# Patient Record
Sex: Female | Born: 1980 | Race: White | Hispanic: No | Marital: Married | Smoking: Former smoker
Health system: Southern US, Community
[De-identification: ages and names within clinical notes are randomized; demographics above are authoritative.]

## PROBLEM LIST (undated history)

## (undated) DIAGNOSIS — F909 Attention-deficit hyperactivity disorder, unspecified type: Secondary | ICD-10-CM

## (undated) DIAGNOSIS — K219 Gastro-esophageal reflux disease without esophagitis: Secondary | ICD-10-CM

## (undated) HISTORY — PX: OTHER SURGICAL HISTORY: SHX169

---
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
## 1999-12-12 ENCOUNTER — Other Ambulatory Visit: Admission: RE | Admit: 1999-12-12 | Discharge: 1999-12-12 | Payer: Self-pay | Admitting: Obstetrics and Gynecology

## 2001-03-18 ENCOUNTER — Other Ambulatory Visit: Admission: RE | Admit: 2001-03-18 | Discharge: 2001-03-18 | Payer: Self-pay | Admitting: Obstetrics and Gynecology

## 2002-06-24 ENCOUNTER — Other Ambulatory Visit: Admission: RE | Admit: 2002-06-24 | Discharge: 2002-06-24 | Payer: Self-pay | Admitting: Obstetrics and Gynecology

## 2003-07-20 ENCOUNTER — Other Ambulatory Visit: Admission: RE | Admit: 2003-07-20 | Discharge: 2003-07-20 | Payer: Self-pay | Admitting: Obstetrics and Gynecology

## 2004-08-15 ENCOUNTER — Other Ambulatory Visit: Admission: RE | Admit: 2004-08-15 | Discharge: 2004-08-15 | Payer: Self-pay | Admitting: Obstetrics and Gynecology

## 2005-05-21 ENCOUNTER — Other Ambulatory Visit: Payer: Self-pay

## 2005-05-21 ENCOUNTER — Emergency Department: Payer: Self-pay | Admitting: Emergency Medicine

## 2008-09-10 ENCOUNTER — Emergency Department: Payer: Self-pay | Admitting: Emergency Medicine

## 2009-11-23 ENCOUNTER — Inpatient Hospital Stay (HOSPITAL_COMMUNITY): Admission: RE | Admit: 2009-11-23 | Discharge: 2009-11-25 | Payer: Self-pay | Admitting: Obstetrics and Gynecology

## 2010-09-26 LAB — CBC
HCT: 31 % — ABNORMAL LOW (ref 36.0–46.0)
Hemoglobin: 11 g/dL — ABNORMAL LOW (ref 12.0–15.0)
Hemoglobin: 13.1 g/dL (ref 12.0–15.0)
MCHC: 34.8 g/dL (ref 30.0–36.0)
MCV: 95.8 fL (ref 78.0–100.0)
MCV: 96.4 fL (ref 78.0–100.0)
Platelets: 213 10*3/uL (ref 150–400)
RBC: 3.91 MIL/uL (ref 3.87–5.11)
RDW: 13.4 % (ref 11.5–15.5)
WBC: 16.6 10*3/uL — ABNORMAL HIGH (ref 4.0–10.5)
WBC: 17.9 10*3/uL — ABNORMAL HIGH (ref 4.0–10.5)

## 2010-09-26 LAB — RPR: RPR Ser Ql: NONREACTIVE

## 2012-07-10 HISTORY — PX: OTHER SURGICAL HISTORY: SHX169

## 2013-03-11 ENCOUNTER — Inpatient Hospital Stay: Payer: Self-pay | Admitting: Surgery

## 2013-03-11 LAB — COMPREHENSIVE METABOLIC PANEL
Anion Gap: 4 — ABNORMAL LOW (ref 7–16)
Bilirubin,Total: 0.6 mg/dL (ref 0.2–1.0)
Calcium, Total: 9 mg/dL (ref 8.5–10.1)
Chloride: 107 mmol/L (ref 98–107)
Creatinine: 1.03 mg/dL (ref 0.60–1.30)
Glucose: 108 mg/dL — ABNORMAL HIGH (ref 65–99)
Osmolality: 279 (ref 275–301)
SGOT(AST): 97 U/L — ABNORMAL HIGH (ref 15–37)
Sodium: 139 mmol/L (ref 136–145)

## 2013-03-11 LAB — CBC
HCT: 41.6 % (ref 35.0–47.0)
MCH: 31.4 pg (ref 26.0–34.0)
MCV: 93 fL (ref 80–100)
Platelet: 305 10*3/uL (ref 150–440)
RDW: 14.2 % (ref 11.5–14.5)
WBC: 11.3 10*3/uL — ABNORMAL HIGH (ref 3.6–11.0)

## 2013-03-11 LAB — URINALYSIS, COMPLETE
Glucose,UR: NEGATIVE mg/dL (ref 0–75)
Ketone: NEGATIVE
Leukocyte Esterase: NEGATIVE
Ph: 5 (ref 4.5–8.0)
Protein: 30
Squamous Epithelial: 3

## 2013-03-11 LAB — LIPASE, BLOOD: Lipase: 139 U/L (ref 73–393)

## 2013-03-12 LAB — COMPREHENSIVE METABOLIC PANEL
Albumin: 2.7 g/dL — ABNORMAL LOW (ref 3.4–5.0)
Alkaline Phosphatase: 101 U/L (ref 50–136)
Anion Gap: 6 — ABNORMAL LOW (ref 7–16)
Bilirubin,Total: 0.4 mg/dL (ref 0.2–1.0)
Chloride: 107 mmol/L (ref 98–107)
Creatinine: 0.74 mg/dL (ref 0.60–1.30)
Osmolality: 274 (ref 275–301)
Sodium: 138 mmol/L (ref 136–145)
Total Protein: 6.4 g/dL (ref 6.4–8.2)

## 2013-03-12 LAB — HCG, QUANTITATIVE, PREGNANCY: Beta Hcg, Quant.: <1 m[IU]/mL — ABNORMAL LOW

## 2013-03-14 LAB — PATHOLOGY REPORT

## 2013-08-05 LAB — OB RESULTS CONSOLE HIV ANTIBODY (ROUTINE TESTING): HIV: NONREACTIVE

## 2014-02-02 LAB — OB RESULTS CONSOLE RUBELLA ANTIBODY, IGM: Rubella: IMMUNE

## 2014-02-02 LAB — OB RESULTS CONSOLE HEPATITIS B SURFACE ANTIGEN: HEP B S AG: NEGATIVE

## 2014-02-02 LAB — OB RESULTS CONSOLE ABO/RH

## 2014-08-03 NOTE — H&P (Signed)
Kendra Bishop is a 34 y.o. female presenting for repeat c/s.  Pregnancy uncomplicated. History OB History    No data available     No past medical history on file. No past surgical history on file. Family History: family history is not on file. Social History:  has no tobacco, alcohol, and drug history on file.   Prenatal Transfer Tool  Maternal Diabetes: No Genetic Screening: Normal Maternal Ultrasounds/Referrals: Normal Fetal Ultrasounds or other Referrals:  None Maternal Substance Abuse:  No Significant Maternal Medications:  None Significant Maternal Lab Results:  None Other Comments:  None  ROS    There were no vitals taken for this visit. Exam Physical Exam   Closed and thick Prenatal labs: ABO, Rh:   Antibody:   Rubella:   RPR:    HBsAg:    HIV:    GBS:     Assessment/Plan: IUP at 39 weeks .  Previous C/S for repeat. Previously tolerated Ancef at 1st C/S Risks and benefits of C/S were discussed.  All questions were answered and informed consent was obtained.  Plan to proceed with low segment transverse Cesarean Section.    Amahd Morino C 08/03/2014, 1:23 PM

## 2014-08-05 ENCOUNTER — Encounter (HOSPITAL_COMMUNITY): Payer: Self-pay | Admitting: *Deleted

## 2014-08-05 ENCOUNTER — Inpatient Hospital Stay (HOSPITAL_COMMUNITY): Payer: BLUE CROSS/BLUE SHIELD | Admitting: Anesthesiology

## 2014-08-05 ENCOUNTER — Inpatient Hospital Stay (HOSPITAL_COMMUNITY)
Admission: AD | Admit: 2014-08-05 | Discharge: 2014-08-07 | DRG: 766 | Disposition: A | Discharge status: Home / Self Care | Payer: BLUE CROSS/BLUE SHIELD | Source: Ambulatory Visit | Attending: Obstetrics and Gynecology | Admitting: Obstetrics and Gynecology

## 2014-08-05 ENCOUNTER — Encounter (HOSPITAL_COMMUNITY)
Admission: AD | Disposition: A | Discharge status: Home / Self Care | Payer: Self-pay | Source: Ambulatory Visit | Attending: Obstetrics and Gynecology

## 2014-08-05 DIAGNOSIS — O3421 Maternal care for scar from previous cesarean delivery: Principal | ICD-10-CM | POA: Diagnosis present

## 2014-08-05 DIAGNOSIS — O4290 Premature rupture of membranes, unspecified as to length of time between rupture and onset of labor, unspecified weeks of gestation: Secondary | ICD-10-CM | POA: Diagnosis present

## 2014-08-05 DIAGNOSIS — Z3A38 38 weeks gestation of pregnancy: Secondary | ICD-10-CM | POA: Diagnosis present

## 2014-08-05 DIAGNOSIS — Z3483 Encounter for supervision of other normal pregnancy, third trimester: Secondary | ICD-10-CM | POA: Diagnosis present

## 2014-08-05 HISTORY — DX: Gastro-esophageal reflux disease without esophagitis: K21.9

## 2014-08-05 LAB — TYPE AND SCREEN
ABO/RH(D): A POS
Antibody Screen: NEGATIVE

## 2014-08-05 LAB — CBC
HCT: 39.1 % (ref 36.0–46.0)
HEMOGLOBIN: 13.3 g/dL (ref 12.0–15.0)
MCH: 32.8 pg (ref 26.0–34.0)
MCHC: 34 g/dL (ref 30.0–36.0)
MCV: 96.3 fL (ref 78.0–100.0)
PLATELETS: 239 10*3/uL (ref 150–400)
RBC: 4.06 MIL/uL (ref 3.87–5.11)
RDW: 14.4 % (ref 11.5–15.5)
WBC: 15.6 10*3/uL — AB (ref 4.0–10.5)

## 2014-08-05 LAB — ABO/RH: ABO/RH(D): A POS

## 2014-08-05 SURGERY — Surgical Case
Anesthesia: Spinal

## 2014-08-05 MED ORDER — ZOLPIDEM TARTRATE 5 MG PO TABS: 5.0000 mg | ORAL_TABLET | Freq: Every evening | ORAL | Status: DC | PRN

## 2014-08-05 MED ORDER — OXYCODONE-ACETAMINOPHEN 5-325 MG PO TABS
2.0000 | ORAL_TABLET | ORAL | Status: DC | PRN
  Administered 2014-08-05 – 2014-08-06 (×3): 2 via ORAL
  Filled 2014-08-05 (×3): qty 2

## 2014-08-05 MED ORDER — LACTATED RINGERS IV SOLN
INTRAVENOUS | Status: DC
  Administered 2014-08-05: 15:00:00 via INTRAVENOUS

## 2014-08-05 MED ORDER — SIMETHICONE 80 MG PO CHEW: 80.0000 mg | CHEWABLE_TABLET | ORAL | Status: DC | PRN

## 2014-08-05 MED ORDER — LACTATED RINGERS IV SOLN
INTRAVENOUS | Status: DC
  Administered 2014-08-05 (×4): via INTRAVENOUS

## 2014-08-05 MED ORDER — SIMETHICONE 80 MG PO CHEW
80.0000 mg | CHEWABLE_TABLET | Freq: Three times a day (TID) | ORAL | Status: DC
Start: 2014-08-05 — End: 2014-08-07
  Administered 2014-08-05 – 2014-08-07 (×5): 80 mg via ORAL
  Filled 2014-08-05 (×6): qty 1

## 2014-08-05 MED ORDER — MENTHOL 3 MG MT LOZG: 1.0000 | LOZENGE | OROMUCOSAL | Status: DC | PRN

## 2014-08-05 MED ORDER — FENTANYL CITRATE 0.05 MG/ML IJ SOLN
INTRAMUSCULAR | Status: DC | PRN
  Administered 2014-08-05: 25 ug via INTRATHECAL

## 2014-08-05 MED ORDER — LACTATED RINGERS IV SOLN
40.0000 [IU] | INTRAVENOUS | Status: DC | PRN
  Administered 2014-08-05: 40 [IU] via INTRAVENOUS

## 2014-08-05 MED ORDER — CEFAZOLIN SODIUM-DEXTROSE 2-3 GM-% IV SOLR
INTRAVENOUS | Status: AC
  Filled 2014-08-05: qty 50

## 2014-08-05 MED ORDER — ONDANSETRON HCL 4 MG/2ML IJ SOLN: 4.0000 mg | INTRAMUSCULAR | Status: DC | PRN

## 2014-08-05 MED ORDER — NALOXONE HCL 1 MG/ML IJ SOLN
1.0000 ug/kg/h | INTRAVENOUS | Status: DC | PRN
  Filled 2014-08-05: qty 2

## 2014-08-05 MED ORDER — OXYTOCIN 10 UNIT/ML IJ SOLN
INTRAMUSCULAR | Status: AC
Start: 2014-08-05 — End: 2014-08-05
  Filled 2014-08-05: qty 4

## 2014-08-05 MED ORDER — IBUPROFEN 600 MG PO TABS
600.0000 mg | ORAL_TABLET | Freq: Four times a day (QID) | ORAL | Status: DC
  Administered 2014-08-05 – 2014-08-07 (×8): 600 mg via ORAL
  Filled 2014-08-05 (×9): qty 1

## 2014-08-05 MED ORDER — LANOLIN HYDROUS EX OINT: 1.0000 "application " | TOPICAL_OINTMENT | CUTANEOUS | Status: DC | PRN

## 2014-08-05 MED ORDER — BUPIVACAINE LIPOSOME 1.3 % IJ SUSP
INTRAMUSCULAR | Status: DC | PRN
  Administered 2014-08-05: 20 mL

## 2014-08-05 MED ORDER — ERYTHROMYCIN 5 MG/GM OP OINT
TOPICAL_OINTMENT | OPHTHALMIC | Status: AC
  Filled 2014-08-05: qty 1

## 2014-08-05 MED ORDER — OXYTOCIN 40 UNITS IN LACTATED RINGERS INFUSION - SIMPLE MED: 62.5000 mL/h | INTRAVENOUS | Status: AC

## 2014-08-05 MED ORDER — FENTANYL CITRATE 0.05 MG/ML IJ SOLN
INTRAMUSCULAR | Status: AC
  Filled 2014-08-05: qty 2

## 2014-08-05 MED ORDER — NALBUPHINE HCL 10 MG/ML IJ SOLN: 5.0000 mg | Freq: Once | INTRAMUSCULAR | Status: AC | PRN

## 2014-08-05 MED ORDER — NALBUPHINE HCL 10 MG/ML IJ SOLN: 5.0000 mg | INTRAMUSCULAR | Status: DC | PRN

## 2014-08-05 MED ORDER — DIPHENHYDRAMINE HCL 25 MG PO CAPS: 25.0000 mg | ORAL_CAPSULE | ORAL | Status: DC | PRN

## 2014-08-05 MED ORDER — SENNOSIDES-DOCUSATE SODIUM 8.6-50 MG PO TABS
2.0000 | ORAL_TABLET | ORAL | Status: DC
  Administered 2014-08-05 – 2014-08-07 (×2): 2 via ORAL
  Filled 2014-08-05 (×2): qty 2

## 2014-08-05 MED ORDER — PHENYLEPHRINE HCL 10 MG/ML IJ SOLN
INTRAMUSCULAR | Status: DC | PRN
  Administered 2014-08-05 (×2): 60 ug via INTRAVENOUS

## 2014-08-05 MED ORDER — PRENATAL MULTIVITAMIN CH
1.0000 | ORAL_TABLET | Freq: Every day | ORAL | Status: DC
  Administered 2014-08-06 – 2014-08-07 (×2): 1 via ORAL
  Filled 2014-08-05 (×2): qty 1

## 2014-08-05 MED ORDER — WITCH HAZEL-GLYCERIN EX PADS: 1.0000 "application " | MEDICATED_PAD | CUTANEOUS | Status: DC | PRN

## 2014-08-05 MED ORDER — MORPHINE SULFATE (PF) 0.5 MG/ML IJ SOLN
INTRAMUSCULAR | Status: DC | PRN
  Administered 2014-08-05: 150 ug via EPIDURAL

## 2014-08-05 MED ORDER — MORPHINE SULFATE 0.5 MG/ML IJ SOLN
INTRAMUSCULAR | Status: AC
  Filled 2014-08-05: qty 10

## 2014-08-05 MED ORDER — ONDANSETRON HCL 4 MG/2ML IJ SOLN
INTRAMUSCULAR | Status: AC
  Filled 2014-08-05: qty 2

## 2014-08-05 MED ORDER — ONDANSETRON HCL 4 MG/2ML IJ SOLN: 4.0000 mg | Freq: Three times a day (TID) | INTRAMUSCULAR | Status: DC | PRN

## 2014-08-05 MED ORDER — SCOPOLAMINE 1 MG/3DAYS TD PT72
MEDICATED_PATCH | TRANSDERMAL | Status: AC
  Filled 2014-08-05: qty 1

## 2014-08-05 MED ORDER — DIBUCAINE 1 % RE OINT: 1.0000 "application " | TOPICAL_OINTMENT | RECTAL | Status: DC | PRN

## 2014-08-05 MED ORDER — CITRIC ACID-SODIUM CITRATE 334-500 MG/5ML PO SOLN
30.0000 mL | Freq: Once | ORAL | Status: AC
  Administered 2014-08-05: 30 mL via ORAL
  Filled 2014-08-05: qty 15

## 2014-08-05 MED ORDER — PHENYLEPHRINE 8 MG IN D5W 100 ML (0.08MG/ML) PREMIX OPTIME
INJECTION | INTRAVENOUS | Status: AC
  Filled 2014-08-05: qty 100

## 2014-08-05 MED ORDER — MEPERIDINE HCL 25 MG/ML IJ SOLN: 6.2500 mg | INTRAMUSCULAR | Status: DC | PRN

## 2014-08-05 MED ORDER — BUPIVACAINE HCL (PF) 0.5 % IJ SOLN
INTRAMUSCULAR | Status: DC | PRN
  Administered 2014-08-05: 11 mg

## 2014-08-05 MED ORDER — NALOXONE HCL 0.4 MG/ML IJ SOLN: 0.4000 mg | INTRAMUSCULAR | Status: DC | PRN

## 2014-08-05 MED ORDER — DIPHENHYDRAMINE HCL 50 MG/ML IJ SOLN: 12.5000 mg | INTRAMUSCULAR | Status: DC | PRN

## 2014-08-05 MED ORDER — ONDANSETRON HCL 4 MG/2ML IJ SOLN
INTRAMUSCULAR | Status: DC | PRN
Start: 2014-08-05 — End: 2014-08-05
  Administered 2014-08-05: 4 mg via INTRAVENOUS

## 2014-08-05 MED ORDER — DIPHENHYDRAMINE HCL 25 MG PO CAPS: 25.0000 mg | ORAL_CAPSULE | Freq: Four times a day (QID) | ORAL | Status: DC | PRN

## 2014-08-05 MED ORDER — SODIUM CHLORIDE 0.9 % IJ SOLN: 3.0000 mL | INTRAMUSCULAR | Status: DC | PRN

## 2014-08-05 MED ORDER — ONDANSETRON HCL 4 MG PO TABS: 4.0000 mg | ORAL_TABLET | ORAL | Status: DC | PRN

## 2014-08-05 MED ORDER — HYDROMORPHONE HCL 1 MG/ML IJ SOLN: 0.2500 mg | INTRAMUSCULAR | Status: DC | PRN

## 2014-08-05 MED ORDER — GENTAMICIN SULFATE 40 MG/ML IJ SOLN
INTRAMUSCULAR | Status: AC
  Administered 2014-08-05: 100 mL via INTRAVENOUS
  Filled 2014-08-05: qty 6.75

## 2014-08-05 MED ORDER — SODIUM CHLORIDE 0.9 % IJ SOLN
INTRAMUSCULAR | Status: AC
  Filled 2014-08-05: qty 20

## 2014-08-05 MED ORDER — SIMETHICONE 80 MG PO CHEW
80.0000 mg | CHEWABLE_TABLET | ORAL | Status: DC
  Administered 2014-08-05 – 2014-08-07 (×2): 80 mg via ORAL
  Filled 2014-08-05 (×2): qty 1

## 2014-08-05 MED ORDER — BUPIVACAINE LIPOSOME 1.3 % IJ SUSP
20.0000 mL | Freq: Once | INTRAMUSCULAR | Status: AC
  Administered 2014-08-05 (×2): 20 mL
  Filled 2014-08-05: qty 20

## 2014-08-05 MED ORDER — OXYCODONE-ACETAMINOPHEN 5-325 MG PO TABS
1.0000 | ORAL_TABLET | ORAL | Status: DC | PRN
  Administered 2014-08-06 – 2014-08-07 (×6): 1 via ORAL
  Filled 2014-08-05 (×6): qty 1

## 2014-08-05 MED ORDER — SCOPOLAMINE 1 MG/3DAYS TD PT72
1.0000 | MEDICATED_PATCH | Freq: Once | TRANSDERMAL | Status: DC
  Administered 2014-08-05: 1.5 mg via TRANSDERMAL

## 2014-08-05 MED ORDER — TETANUS-DIPHTH-ACELL PERTUSSIS 5-2.5-18.5 LF-MCG/0.5 IM SUSP: 0.5000 mL | Freq: Once | INTRAMUSCULAR | Status: DC

## 2014-08-05 MED ORDER — FAMOTIDINE IN NACL 20-0.9 MG/50ML-% IV SOLN
20.0000 mg | Freq: Once | INTRAVENOUS | Status: AC
  Administered 2014-08-05: 20 mg via INTRAVENOUS
  Filled 2014-08-05: qty 50

## 2014-08-05 SURGICAL SUPPLY — 37 items
BENZOIN TINCTURE PRP APPL 2/3 (GAUZE/BANDAGES/DRESSINGS) ×3 IMPLANT
CLAMP CORD UMBIL (MISCELLANEOUS) IMPLANT
CLOSURE WOUND 1/2 X4 (GAUZE/BANDAGES/DRESSINGS) ×1
CLOTH BEACON ORANGE TIMEOUT ST (SAFETY) ×3 IMPLANT
CONTAINER PREFILL 10% NBF 15ML (MISCELLANEOUS) IMPLANT
DRAPE SHEET LG 3/4 BI-LAMINATE (DRAPES) IMPLANT
DRSG OPSITE POSTOP 4X10 (GAUZE/BANDAGES/DRESSINGS) ×3 IMPLANT
DURAPREP 26ML APPLICATOR (WOUND CARE) ×3 IMPLANT
ELECT REM PT RETURN 9FT ADLT (ELECTROSURGICAL) ×3
ELECTRODE REM PT RTRN 9FT ADLT (ELECTROSURGICAL) ×1 IMPLANT
EXTRACTOR VACUUM M CUP 4 TUBE (SUCTIONS) IMPLANT
EXTRACTOR VACUUM M CUP 4' TUBE (SUCTIONS)
GLOVE BIO SURGEON STRL SZ7.5 (GLOVE) ×3 IMPLANT
GOWN STRL REUS W/TWL LRG LVL3 (GOWN DISPOSABLE) ×6 IMPLANT
KIT ABG SYR 3ML LUER SLIP (SYRINGE) ×3 IMPLANT
LIQUID BAND (GAUZE/BANDAGES/DRESSINGS) IMPLANT
NEEDLE HYPO 21X1.5 SAFETY (NEEDLE) ×3 IMPLANT
NEEDLE HYPO 25X5/8 SAFETYGLIDE (NEEDLE) ×3 IMPLANT
NS IRRIG 1000ML POUR BTL (IV SOLUTION) ×3 IMPLANT
PACK C SECTION WH (CUSTOM PROCEDURE TRAY) ×3 IMPLANT
PAD ABD 7.5X8 STRL (GAUZE/BANDAGES/DRESSINGS) ×3 IMPLANT
PAD OB MATERNITY 4.3X12.25 (PERSONAL CARE ITEMS) ×3 IMPLANT
SPONGE GAUZE 4X4 12PLY STER LF (GAUZE/BANDAGES/DRESSINGS) ×6 IMPLANT
STAPLER VISISTAT 35W (STAPLE) IMPLANT
STRIP CLOSURE SKIN 1/2X4 (GAUZE/BANDAGES/DRESSINGS) ×2 IMPLANT
SUT CHROMIC 2 0 SH (SUTURE) ×3 IMPLANT
SUT MNCRL 0 VIOLET CTX 36 (SUTURE) ×4 IMPLANT
SUT MONOCRYL 0 CTX 36 (SUTURE) ×8
SUT PDS AB 0 CTX 60 (SUTURE) ×3 IMPLANT
SUT PLAIN 0 NONE (SUTURE) IMPLANT
SUT PLAIN 2 0 (SUTURE)
SUT PLAIN 2 0 XLH (SUTURE) ×3 IMPLANT
SUT PLAIN ABS 2-0 CT1 27XMFL (SUTURE) IMPLANT
SUT VIC AB 4-0 KS 27 (SUTURE) ×3 IMPLANT
SYR 20CC LL (SYRINGE) ×3 IMPLANT
TOWEL OR 17X24 6PK STRL BLUE (TOWEL DISPOSABLE) ×3 IMPLANT
TRAY FOLEY CATH 14FR (SET/KITS/TRAYS/PACK) ×3 IMPLANT

## 2014-08-05 NOTE — Anesthesia Procedure Notes (Signed)
Spinal Patient location during procedure: OR Start time: 08/05/2014 8:22 AM Staffing Anesthesiologist: Brayton CavesJACKSON, Eran Mistry Performed by: anesthesiologist  Preanesthetic Checklist Completed: patient identified, site marked, surgical consent, pre-op evaluation, timeout performed, IV checked, risks and benefits discussed and monitors and equipment checked Spinal Block Patient position: sitting Prep: DuraPrep Patient monitoring: heart rate, cardiac monitor, continuous pulse ox and blood pressure Approach: midline Location: L3-4 Injection technique: single-shot Needle Needle type: Sprotte  Needle gauge: 24 G Needle length: 9 cm Assessment Sensory level: T4 Additional Notes Patient identified.  Risk benefits discussed including failed block, incomplete pain control, headache, nerve damage, paralysis, blood pressure changes, nausea, vomiting, reactions to medication both toxic or allergic, and postpartum back pain.  Patient expressed understanding and wished to proceed.  All questions were answered.  Sterile technique used throughout procedure.  CSF was clear.  No parasthesia or other complications.  Please see nursing notes for vital signs.

## 2014-08-05 NOTE — MAU Note (Signed)
Anesthesia on [id number] notified, Blane OharaCaroline RN [facility] notified,

## 2014-08-05 NOTE — Transfer of Care (Signed)
Immediate Anesthesia Transfer of Care Note  Patient: Kendra Bishop  Procedure(s) Performed: Procedure(s): CESAREAN SECTION (N/A)  Patient Location: PACU  Anesthesia Type:Spinal  Level of Consciousness: awake, alert  and oriented  Airway & Oxygen Therapy: Patient Spontanous Breathing  Post-op Assessment: Report given to PACU RN and Post -op Vital signs reviewed and stable  Post vital signs: Reviewed and stable  Complications: No apparent anesthesia complications

## 2014-08-05 NOTE — Op Note (Signed)
Kendra Bishop, LAFEVERS NO.:  [id number]  MEDICAL RECORD NO.:  [id number]  LOCATION:  [facility]                          FACILITY:  [facility]  PHYSICIAN:  Guy Sandifer. Henderson Cloud, M.D. DATE OF BIRTH:  [birth year]  DATE OF PROCEDURE:  08/05/2014 DATE OF DISCHARGE:                              OPERATIVE REPORT   PREOPERATIVE DIAGNOSES: 1. Intrauterine pregnancy at 27 and 4/7th weeks. 2. Spontaneous rupture of membranes. 3. Previous cesarean section, desires repeat.  POSTOPERATIVE DIAGNOSES: 1. Intrauterine pregnancy at 21 and 4/7th weeks. 2. Spontaneous rupture of membranes. 3. Previous cesarean section, desires repeat.  PROCEDURE:  Repeat low-transverse cesarean section.  SURGEON:  Guy Sandifer. Henderson Cloud, M.D.  ANESTHESIA:  Spinal.  ESTIMATED BLOOD LOSS:  150 mL.  SPECIMENS:  Placenta to Pathology.  FINDINGS:  Viable female infant, Apgars of 9 and 9 at one and five minutes respectively.  Birth weight and arterial cord pH pending.  INDICATIONS AND CONSENT:  This patient is a 34 year old G2, P1, with a previous cesarean section.  She had spontaneous rupture of membranes for clear fluid this morning.  Pregnancy has been complicated for newly- diagnosed cholestasis of pregnancy.  She took her first and only dose of Ursodiol last evening.  The patient desires repeat cesarean section. Potential risks and complications were discussed with the patient and her husband preoperatively including, but not limited to, infection, organ damage, bleeding requiring transfusion of blood products with HIV and hepatitis acquisition, DVT, PE, pneumonia, pelvic pain, abdominal pain, return to the OR, wound breakdown.  All questions have been answered and consent is signed on the chart.  PROCEDURE:  The patient was taken to the operating room, where she was identified.  Spinal anesthetic was placed per Anesthesiology and she was placed in a dorsal supine position with a left lateral wedge.  She  was then prepped, Foley catheter was placed to the bladder to drain.  She was draped per [facility] protocol.  Testing for adequate spinal anesthesia was carried out.  Time-out was undertaken.  Skin was entered through the previous Pfannenstiel scar.  Dissection was carried out in layers to the peritoneum.  There was scarring from the lower uterine segment to the anterior abdominal wall, which was taken down without difficulty.  This allowed development of the bladder flap and the bladder blade was placed.  Uterus was incised in a low-transverse manner.  The uterine cavity was entered bluntly with a hemostat.  The uterine incision was extended cephalad laterally with fingers.  Vertex was delivered without difficulty.  Baby was delivered, good cry and tone was noted.  Cord was clamped and cut.  The baby was handed to awaiting pediatrics team.  Placenta was manually delivered and sent to Pathology. Uterus was cleaned.  Uterus was closed in 2 running locking imbricating layers of 0 Monocryl suture, which achieved good hemostasis.  Anterior peritoneum was closed in a running fashion with 0-Monocryl suture, which was also used to reapproximate the pyramidalis muscle in the midline. The anterior rectus fascia was closed in a running fashion with a 0- looped PDS suture.  There was a small bleeder at the margin of the fascia in the center of  the incision, which was controlled with a 2-0 chromic suture.  20 mL of Exparel diluted in 20 mL of saline was then injected both subfascially and subcutaneously.  The subcutaneous layer was closed with interrupted plain suture and the skin was closed with subcuticular Vicryl on a Keith needle.  Steri-Strips and pressure dressing was applied.  The urine remained clear.  All counts were correct.  The patient was awakened and the patient was taken to recovery room in stable condition.     Guy SandiferJames E. Henderson Cloudomblin, M.D.     JET/MEDQ  D:  08/05/2014  T:   08/05/2014  Job:  [id number]

## 2014-08-05 NOTE — Brief Op Note (Signed)
08/05/2014  9:18 AM  PATIENT:  Kendra Bishop  34 y.o. female  PRE-OPERATIVE DIAGNOSIS:  CESAREAN SECTION  POST-OPERATIVE DIAGNOSIS:  CESAREAN SECTION  PROCEDURE:  Procedure(s): CESAREAN SECTION (N/A)  SURGEON:  Surgeon(s) and Role:    * Leslie AndreaJames E Mayo Owczarzak II, MD - Primary  PHYSICIAN ASSISTANT:   ASSISTANTS: none   ANESTHESIA:   spinal  EBL:  Total I/O In: 1000 [I.V.:1000] Out: 275 [Urine:75; Blood:200]  BLOOD ADMINISTERED:none  DRAINS: Urinary Catheter (Foley)   LOCAL MEDICATIONS USED:  OTHER Exparel 20ml in Saline 20 ml  SPECIMEN:  Source of Specimen:  placenta  DISPOSITION OF SPECIMEN:  PATHOLOGY  COUNTS:  YES  TOURNIQUET:  * No tourniquets in log *  DICTATION: .Other Dictation: Dictation Number [id number]  PLAN OF CARE: Admit to inpatient   PATIENT DISPOSITION:  PACU - hemodynamically stable.   Delay start of Pharmacological VTE agent (>24hrs) due to surgical blood loss or risk of bleeding: not applicable

## 2014-08-05 NOTE — MAU Note (Signed)
G2P1 at 38.5 wks with c/o water breaking at 0430, clear fluid.  Comes in with towel btwn her legs; contractions started at 0500.  No vag bleeding, reports good fetal movement.  C-Section scheduled for this Friday for repeat.

## 2014-08-05 NOTE — Anesthesia Postprocedure Evaluation (Signed)
  Anesthesia Post Note  Patient: Kendra Bishop  Procedure(s) Performed: Procedure(s) (LRB): CESAREAN SECTION (N/A)  Anesthesia type: Spinal  Patient location: PACU  Post pain: Pain level controlled  Post assessment: Post-op Vital signs reviewed  Last Vitals:  Filed Vitals:   08/05/14 0729  BP: 125/68  Pulse: 77  Temp:   Resp: 18    Post vital signs: Reviewed  Level of consciousness: awake  Complications: No apparent anesthesia complications

## 2014-08-05 NOTE — Lactation Note (Signed)
This note was copied from the chart of Kendra Bishop. Lactation Consultation Note Initial visit at 8 hours of age.  Mom reports good feedings.  Baby is latched now and has intermittently been feeding for about 25 minutes.  Offered to assist with latching to other breast and mom declines, but plans to alternate breast with next feeding.  [facility] given and discussed.  Encouraged to feed with early cues on demand.  Early newborn behavior discussed.  Hand expression reported by mom with colostrum visible.  Mom to call for assist as needed.    Patient Name: Kendra Bishop ZOXWR'UToday's Date: 08/05/2014 Reason for consult: Initial assessment   Maternal Data Has patient been taught Hand Expression?: Yes Does the patient have breastfeeding experience prior to this delivery?: Yes  Feeding Feeding Type: Breast Fed Length of feed: 45 min  LATCH Score/Interventions Latch: Grasps breast easily, tongue down, lips flanged, rhythmical sucking. Intervention(s): Skin to skin  Audible Swallowing: A few with stimulation  Type of Nipple: Everted at rest and after stimulation  Comfort (Breast/Nipple): Soft / non-tender     Hold (Positioning): Assistance needed to correctly position infant at breast and maintain latch. Intervention(s): Skin to skin;Position options;Support Pillows;Breastfeeding basics reviewed  LATCH Score: 8  Lactation Tools Discussed/Used     Consult Status Consult Status: Follow-up Date: 08/06/14 Follow-up type: In-patient    Kendra RisenShoptaw, Kendra Bishop 08/05/2014, 5:39 PM

## 2014-08-05 NOTE — Anesthesia Preprocedure Evaluation (Signed)
Anesthesia Evaluation  Patient identified by MRN, date of birth, ID band Patient awake    Reviewed: Allergy & Precautions, H&P , Patient's Chart, lab work & pertinent test results  Airway Mallampati: II  TM Distance: >3 FB Neck ROM: full    Dental no notable dental hx.    Pulmonary former smoker,  breath sounds clear to auscultation  Pulmonary exam normal       Cardiovascular Exercise Tolerance: Good Rhythm:regular Rate:Normal     Neuro/Psych    GI/Hepatic GERD-  ,  Endo/Other    Renal/GU      Musculoskeletal   Abdominal   Peds  Hematology   Anesthesia Other Findings   Reproductive/Obstetrics                             Anesthesia Physical Anesthesia Plan  ASA: II  Anesthesia Plan: Spinal   Post-op Pain Management:    Induction:   Airway Management Planned:   Additional Equipment:   Intra-op Plan:   Post-operative Plan:   Informed Consent: I have reviewed the patients History and Physical, chart, labs and discussed the procedure including the risks, benefits and alternatives for the proposed anesthesia with the patient or authorized representative who has indicated his/her understanding and acceptance.   Dental Advisory Given  Plan Discussed with: CRNA  Anesthesia Plan Comments: (Lab work confirmed with CRNA in room. Platelets okay. Discussed spinal anesthetic, and patient consents to the procedure:  included risk of possible headache,backache, failed block, allergic reaction, and nerve injury. This patient was asked if she had any questions or concerns before the procedure started. )        Anesthesia Quick Evaluation

## 2014-08-05 NOTE — H&P (Signed)
Kendra Bishop is a 34 y.o. female presenting for SROM this am. Had previous c/s for breech and desires repeat. Itching since about 36 weeks. Apparently bile salts elevated. Took first and only dose of ursadiol last evening. Maternal Medical History:  Reason for admission: Rupture of membranes.   Fetal activity: Perceived fetal activity is normal.      OB History    Gravida Para Term Preterm AB TAB SAB Ectopic Multiple Living   2 1 1       1      Past Medical History  Diagnosis Date  . GERD (gastroesophageal reflux disease)    Past Surgical History  Procedure Laterality Date  . Cesarean section    . Cholecysectomy     Family History: family history is not on file. Social History:  reports that she quit smoking about 3 months ago. She does not have any smokeless tobacco history on file. She reports that she does not drink alcohol or use illicit drugs.   Prenatal Transfer Tool  Maternal Diabetes: No Genetic Screening: Normal Maternal Ultrasounds/Referrals: Normal Fetal Ultrasounds or other Referrals:  None Maternal Substance Abuse:  No Significant Maternal Medications:  Meds include: Other: ursodiol Significant Maternal Lab Results:  None Other Comments:  None  Review of Systems  Eyes: Negative for blurred vision.  Gastrointestinal: Negative for abdominal pain.  Skin: Positive for itching.  Neurological: Negative for headaches.    Dilation: 2.5 Effacement (%): 50 Exam by:: Fleeta EmmerN. Dunbar RN Blood pressure 125/68, pulse 77, temperature 98.5 F (36.9 C), temperature source Oral, resp. rate 18, height 5' (1.524 m), weight 151 lb (68.493 kg), last menstrual period 11/08/2013. Maternal Exam:  Abdomen: Patient reports no abdominal tenderness.   Fetal Exam Fetal State Assessment: Category I - tracings are normal.     Physical Exam  Cardiovascular: Normal rate and regular rhythm.   Respiratory: Effort normal and breath sounds normal.  GI: Soft. There is no tenderness.   Neurological: She has normal reflexes.    Prenatal labs: ABO, Rh: --/--/A POS (01/27 [id number]) Antibody: PENDING (01/27 0650) Rubella:   RPR:    HBsAg:    HIV:    GBS:     Assessment/Plan: 34 yo G2P1 @ 38 4/7 weeks for repeat cesarean section D/W patient and husband procedure and risks including infection, organ damage, bleeding/transfusion-HIV/Hep, DVT/PE,pneumonia, return to OR, wound breakdown,and abdominal/pelvic pain. Patient states she understands and agrees, all questions answered   Hitomi Slape II,Lyvonne Cassell E 08/05/2014, 7:50 AM

## 2014-08-06 ENCOUNTER — Inpatient Hospital Stay (HOSPITAL_COMMUNITY): Admission: RE | Admit: 2014-08-06 | Payer: Self-pay | Source: Ambulatory Visit

## 2014-08-06 LAB — CBC
HEMATOCRIT: 33.7 % — AB (ref 36.0–46.0)
Hemoglobin: 11.4 g/dL — ABNORMAL LOW (ref 12.0–15.0)
MCH: 33 pg (ref 26.0–34.0)
MCHC: 33.8 g/dL (ref 30.0–36.0)
MCV: 97.7 fL (ref 78.0–100.0)
Platelets: 190 10*3/uL (ref 150–400)
RBC: 3.45 MIL/uL — AB (ref 3.87–5.11)
RDW: 14.6 % (ref 11.5–15.5)
WBC: 15.6 10*3/uL — ABNORMAL HIGH (ref 4.0–10.5)

## 2014-08-06 NOTE — Lactation Note (Signed)
This note was copied from the chart of Kendra Bishop Murakami. Lactation Consultation Note: Follow up visit with mom. She reports baby has been nursing well today until circ this morning.Has been sleepy since. Eating lunch- states she will try again when she finishes. Reviewed cluster feeding when he wakes from circ and tonight. Encouraged to take a nap this afternoon. No questions at present. To call prn Patient Name: Kendra Bishop Nolet ZOXWR'UToday's Date: 08/06/2014 Reason for consult: Follow-up assessment   Maternal Data Formula Feeding for Exclusion: No Does the patient have breastfeeding experience prior to this delivery?: Yes  Feeding    LATCH Score/Interventions                      Lactation Tools Discussed/Used     Consult Status Consult Status: PRN    Pamelia HoitWeeks, Yaritzy Huser D 08/06/2014, 1:18 PM

## 2014-08-06 NOTE — Anesthesia Postprocedure Evaluation (Signed)
  Anesthesia Post-op Note  Patient: Kendra Bishop  Procedure(s) Performed: Procedure(s): CESAREAN SECTION (N/A)  Patient Location: Mother/Baby  Anesthesia Type:Spinal  Level of Consciousness: awake, alert , oriented and patient cooperative  Airway and Oxygen Therapy: Patient Spontanous Breathing  Post-op Pain: mild  Post-op Assessment: Patient's Cardiovascular Status Stable, Respiratory Function Stable, No headache, No backache, No residual numbness and No residual motor weakness  Post-op Vital Signs: stable  Last Vitals:  Filed Vitals:   08/06/14 0530  BP: 112/64  Pulse: 55  Temp: 36.7 C  Resp: 18    Complications: No apparent anesthesia complications

## 2014-08-06 NOTE — Progress Notes (Signed)
Subjective: Postpartum Day 1: Cesarean Delivery Patient reports tolerating PO, + flatus and no problems voiding.    Objective: Vital signs in last 24 hours: Temp:  [97.5 F (36.4 C)-98.3 F (36.8 C)] 98.1 F (36.7 C) (01/28 0530) Pulse Rate:  [47-82] 55 (01/28 0530) Resp:  [13-22] 18 (01/28 0530) BP: (102-126)/(45-96) 112/64 mmHg (01/28 0530) SpO2:  [92 %-99 %] 96 % (01/28 0530)  Physical Exam:  General: alert and cooperative Lochia: appropriate Uterine Fundus: firm Incision: old drainage noted on honeycomb dressing DVT Evaluation: No evidence of DVT seen on physical exam. Negative Homan's sign. No cords or calf tenderness. No significant calf/ankle edema.   Recent Labs  08/05/14 0650 08/06/14 0555  HGB 13.3 11.4*  HCT 39.1 33.7*    Assessment/Plan: Status post Cesarean section. Doing well postoperatively.  Continue current care.  Britzy Graul G 08/06/2014, 8:37 AM

## 2014-08-07 ENCOUNTER — Encounter (HOSPITAL_COMMUNITY): Payer: Self-pay | Admitting: Obstetrics and Gynecology

## 2014-08-07 ENCOUNTER — Inpatient Hospital Stay (HOSPITAL_COMMUNITY)
Admission: AD | Admit: 2014-08-07 | Payer: BLUE CROSS/BLUE SHIELD | Source: Ambulatory Visit | Admitting: Obstetrics and Gynecology

## 2014-08-07 DIAGNOSIS — O4290 Premature rupture of membranes, unspecified as to length of time between rupture and onset of labor, unspecified weeks of gestation: Secondary | ICD-10-CM | POA: Diagnosis present

## 2014-08-07 LAB — RPR: RPR Ser Ql: NONREACTIVE

## 2014-08-07 SURGERY — Surgical Case
Anesthesia: Regional

## 2014-08-07 NOTE — Progress Notes (Signed)
Last feeding time for baby boy @ 2305 for 55 min.   Encouraged mother to try to feed baby again, discussed some ways to wake baby for feeding.  Mother says, " when he wakes I will feed him."

## 2014-08-07 NOTE — Discharge Summary (Signed)
Obstetric Discharge Summary Reason for Admission: rupture of membranes Prenatal Procedures: ultrasound Intrapartum Procedures: cesarean: low cervical, transverse Postpartum Procedures: none Complications-Operative and Postpartum: none HEMOGLOBIN  Date Value Ref Range Status  08/06/2014 11.4* 12.0 - 15.0 g/dL Final   HCT  Date Value Ref Range Status  08/06/2014 33.7* 36.0 - 46.0 % Final    Physical Exam:  General: alert and cooperative Lochia: appropriate Uterine Fundus: firm Incision: healing well DVT Evaluation: No evidence of DVT seen on physical exam. Negative Homan's sign. No cords or calf tenderness. No significant calf/ankle edema.  Discharge Diagnoses: Term Pregnancy-delivered  Discharge Information: Date: 08/07/2014 Activity: pelvic rest Diet: routine Medications: PNV, Ibuprofen and Percocet Condition: stable Instructions: refer to practice specific booklet Discharge to: home   Newborn Data: Live born female  Birth Weight: 7 lb 0.4 oz (3185 g) APGAR: 9, 9  Home with mother.  Kendra Bishop G 08/07/2014, 8:52 AM

## 2014-09-17 ENCOUNTER — Other Ambulatory Visit: Payer: Self-pay | Admitting: Obstetrics and Gynecology

## 2014-09-18 LAB — CYTOLOGY - PAP

## 2014-10-30 NOTE — H&P (Signed)
Subjective/Chief Complaint abd pain and nausea   History of Present Illness 34 y/o female presents with sudden onset of epigastric pain at 4 am this am, mild nausea no emesis, low grade temp. no sick contacts.  Previous episodes over last month or so mainly occuring at night in similar but less intense fachion which have lasted several hours then resolved.  She does not feel pain is related to eating.  Mother has hx of cholelithiasis.   Past History C section 3 years ago.   Primary Physician [facility].   Past Med/Surgical Hx:  denies:   denies surg hx:   ALLERGIES:  PCN: SOB  Dimetapp: SOB   Other Allergies none   HOME MEDICATIONS: Medication Instructions Status  omeprazole 40 mg oral delayed release capsule 1 cap(s) orally once a day Active  birth control  Active    Medications none   Family and Social History:  Family History Other  see above   Social History positive  tobacco, negative ETOH, negative Illicit drugs   + Tobacco Current (within 1 year)   Place of Living Home   Review of Systems:  Subjective/Chief Complaint see HPI.   Physical Exam:  GEN no acute distress, thin   HEENT pale conjunctivae, PERRL   NECK supple   RESP normal resp effort  clear BS   CARD regular rate  no murmur   ABD positive tenderness  no hernia  soft  possible mild murphy's sign, most tenderness overlies epigastric area.   LYMPH negative neck   EXTR negative cyanosis/clubbing, negative edema   SKIN normal to palpation   NEURO cranial nerves intact   PSYCH alert, A+O to time, place, person   Lab Results: Hepatic:  02-Sep-14 04:05   Bilirubin, Total 0.6  Alkaline Phosphatase 119  SGPT (ALT) 58  SGOT (AST)  97  Total Protein, Serum 7.9  Albumin, Serum 3.4  Routine Chem:  02-Sep-14 04:05   Glucose, Serum  108  BUN 14  Creatinine (comp) 1.03  Sodium, Serum 139  Potassium, Serum 3.5  Chloride, Serum 107  CO2, Serum 28  Calcium (Total), Serum 9.0  Osmolality  (calc) 279  eGFR (African American) >60  eGFR (Non-African American) >60 (eGFR values <23m/min/1.73 m2 may be an indication of chronic kidney disease (CKD). Calculated eGFR is useful in patients with stable renal function. The eGFR calculation will not be reliable in acutely ill patients when serum creatinine is changing rapidly. It is not useful in  patients on dialysis. The eGFR calculation may not be applicable to patients at the low and high extremes of body sizes, pregnant women, and vegetarians.)  Anion Gap  4  Lipase 139 (Result(s) reported on 11 Mar 2013 at 04:55AM.)  Routine UA:  02-Sep-14 04:25   Color (UA) Amber  Clarity (UA) Clear  Glucose (UA) Negative  Bilirubin (UA) Negative  Ketones (UA) Negative  Specific Gravity (UA) 1.031  Blood (UA) 1+  pH (UA) 5.0  Protein (UA) 30 mg/dL  Nitrite (UA) Negative  Leukocyte Esterase (UA) Negative (Result(s) reported on 11 Mar 2013 at 04:53AM.)  RBC (UA) 4 /HPF  WBC (UA) 2 /HPF  Bacteria (UA) TRACE  Epithelial Cells (UA) 3 /HPF  Mucous (UA) PRESENT (Result(s) reported on 11 Mar 2013 at 04:53AM.)  Routine Hem:  02-Sep-14 04:05   WBC (CBC)  11.3  RBC (CBC) 4.47  Hemoglobin (CBC) 14.1  Hematocrit (CBC) 41.6  Platelet Count (CBC) 305 (Result(s) reported on 11 Mar 2013 at 04:48AM.)  MCV  39  MCH 31.4  MCHC 33.8  RDW 14.2    Assessment/Admission Diagnosis 34 y/o with hx biliary colic now with more acute and unrelenting pain compare to previous episodes imaging with CT scan and [place] suggestive of acute calculus cholecystitis along centralized pain rasises possibilty of CBD process   Plan Admit, hydrate, review films with radiologist, possible HIDA.  May benefit from cholecystectomy in near future.   Electronic Signatures: Sherri Rad (MD)  (Signed 02-Sep-14 11:45)  Authored: CHIEF COMPLAINT and HISTORY, PAST MEDICAL/SURGIAL HISTORY, ALLERGIES, Other Allergies, HOME MEDICATIONS, OTHER MEDICATIONS, FAMILY AND SOCIAL HISTORY,  REVIEW OF SYSTEMS, PHYSICAL EXAM, LABS, ASSESSMENT AND PLAN   Last Updated: 02-Sep-14 11:45 by Sherri Rad (MD)

## 2014-10-30 NOTE — Op Note (Signed)
PATIENT NAME:  Kendra MirzaHOMAS, Jamy W MR#:  [id number] DATE OF BIRTH:  [birth year]  DATE OF PROCEDURE:  03/12/2013  PREOPERATIVE DIAGNOSIS: Acute cholecystitis.   POSTOPERATIVE DIAGNOSIS: Cholecystitis and biliary colic.   PROCEDURE PERFORMED: Laparoscopic cholecystectomy.   SURGEON: Natale LayMark Darrold Bezek, M.D.   ASSISTANT: None.   ANESTHESIA: General endotracheal.   FINDINGS: Stones.   SPECIMENS: Gallbladder with contents.   ESTIMATED BLOOD LOSS: Minimal.   DESCRIPTION OF PROCEDURE: With informed consent, supine position, general endotracheal anesthesia, the patient's abdomen was prepped and draped with ChloraPrep solution. Timeout was observed. A 12 mm blunt Hassan trocar was placed through an infraumbilical transverse oriented skin incision with stay sutures being passed through the fascia. Pneumoperitoneum was established. The patient was then positioned in reverse Trendelenburg and airplane right side up. A 5 mm Bladeless trocar was placed in the epigastric region. Two 5-mm ports laterally. The gallbladder was grasped along its fundus and elevated towards the right shoulder. Lateral traction was achieved on Hartman's pouch. The hepatoduodenal ligament was incised and dissected. Critical view of safety cholecystectomy being achieved. Three clips being placed on the portal side of the cystic duct, 2 on the cystic artery and the structure was then divided with sharp scissors. The gallbladder was then retrieved off the gallbladder fossa utilizing hook cautery apparatus, placed into an Endo Catch device and retrieved. Pneumoperitoneum was established. Inspection of the umbilical port site demonstrated no evidence of bowel injury. Hemostasis appeared to be adequate on the operative field. Ports were then removed under direct visualization. The infraumbilical fascial defect being reapproximated with an additional figure-of-eight #0 Vicryl suture in transverse orientation and the existing stay sutures tied to each  other. A total of 30 mL of 0.25% plain Marcaine was infiltrated along all skin and fascial incisions prior to closure. 4-0 Vicryl subcuticular was then applied to all skin edges followed by the application of benzoin, Steri-Strips, Telfa and Tegaderm. The patient was then subsequently extubated and taken to the recovery room in stable and satisfactory condition by anesthesia services.    ____________________________ Redge GainerMark A. Egbert GaribaldiBird, MD mab:aw D: 03/13/2013 09:57:32 ET T: 03/13/2013 10:07:56 ET JOB#: [id number]  cc: Loraine LericheMark A. Egbert GaribaldiBird, MD, <Dictator> Raynald KempMARK A Chistopher Mangino MD ELECTRONICALLY SIGNED 03/18/2013 9:42

## 2015-02-16 IMAGING — CT CT ABD-PELV W/ CM
1 of 3 series · 14 of 32 positions shown, 19 images · non-contrast
Comparison: none

REASON FOR EXAM: (1) generalized abdominal pain, nausea; (2) generalized
abdominal pain, nausea
COMMENTS:

[Series 2: 3mm soft tissue · axial · 0.68mm/px · z∈[-1080,-747]mm · 14 of 127 slices shown, 19 images]
[im 8/127  soft-tissue]
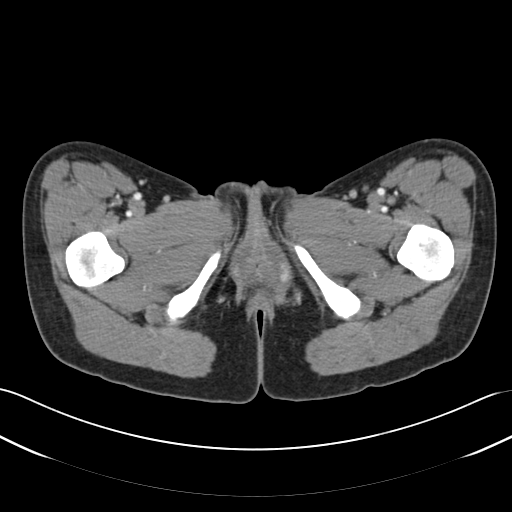
[im 8/127  bone]
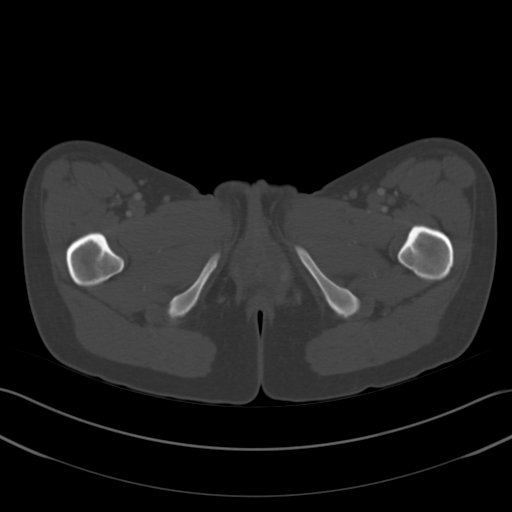
[im 15/127  soft-tissue]
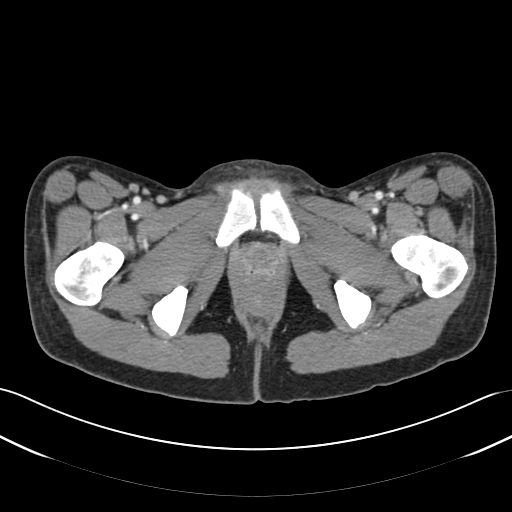
[im 30/127  soft-tissue]
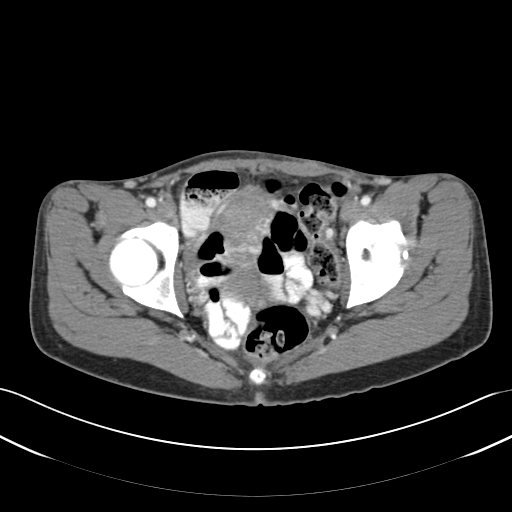
[im 38/127  soft-tissue]
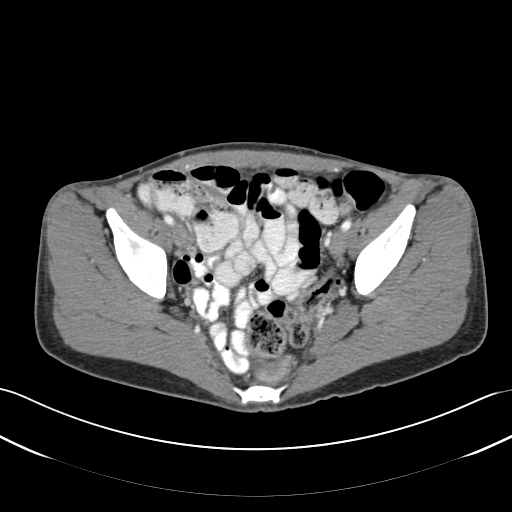
[im 45/127  soft-tissue]
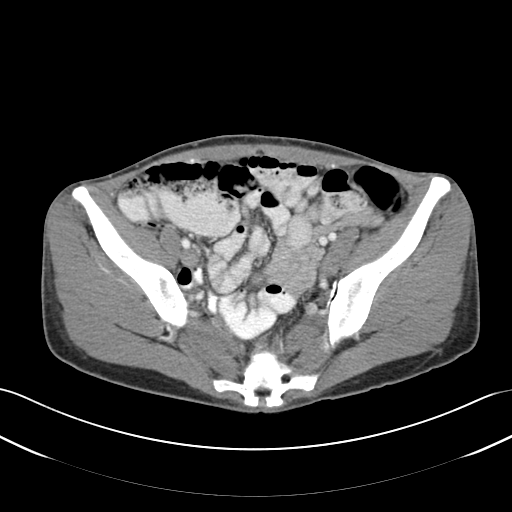
[im 52/127  soft-tissue]
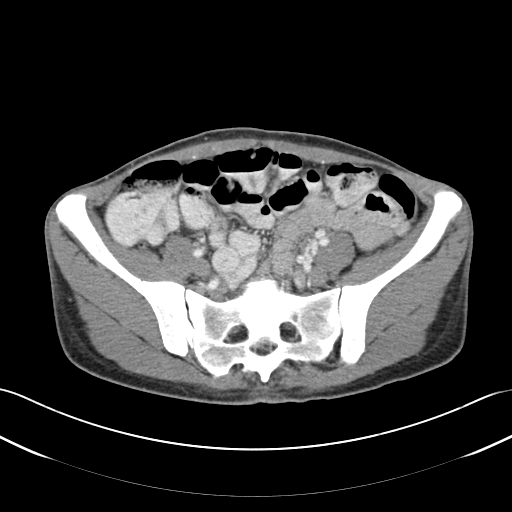
[im 67/127  soft-tissue]
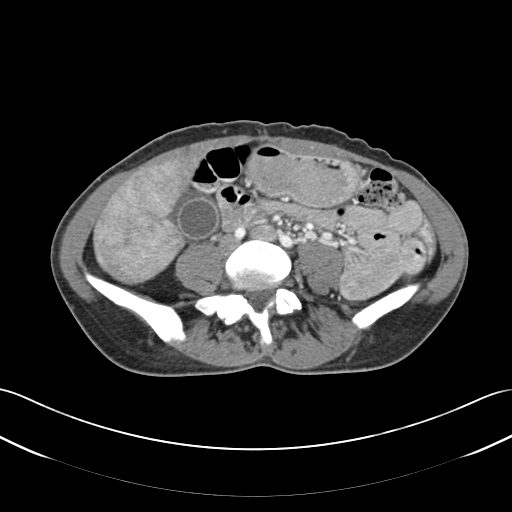
[im 75/127  soft-tissue]
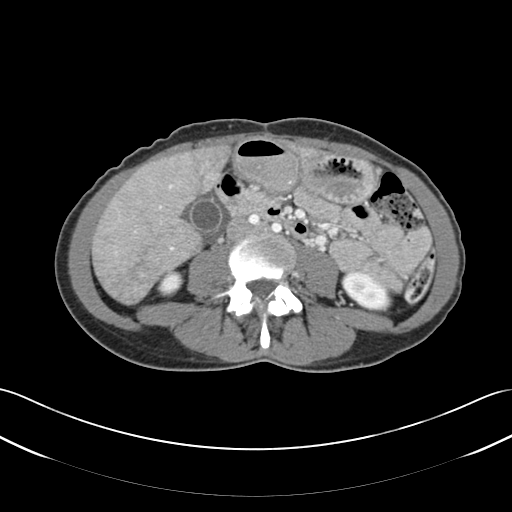
[im 82/127  soft-tissue]
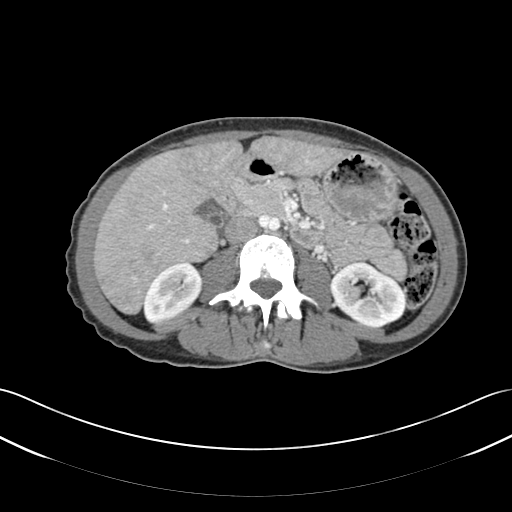
[im 82/127  bone]
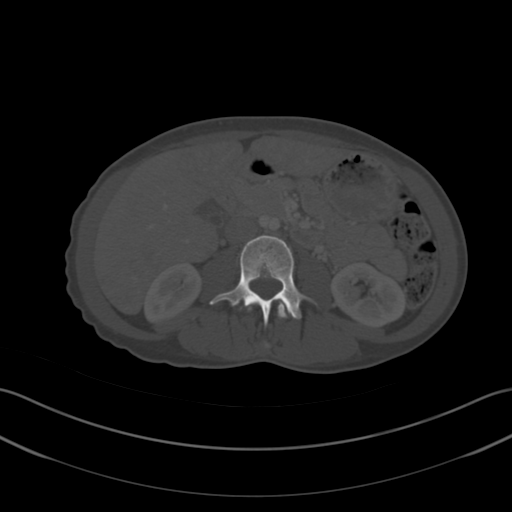
[im 89/127  soft-tissue]
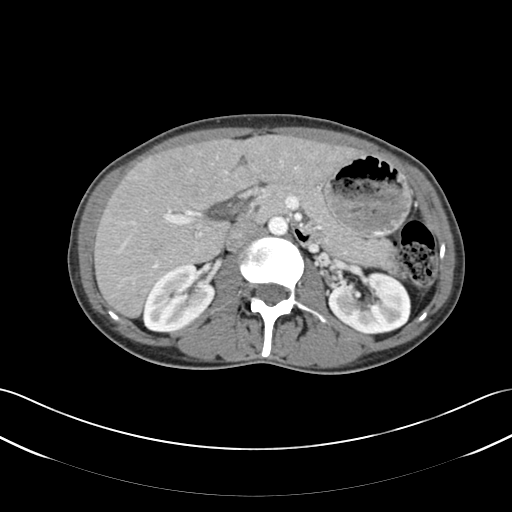
[im 97/127  soft-tissue]
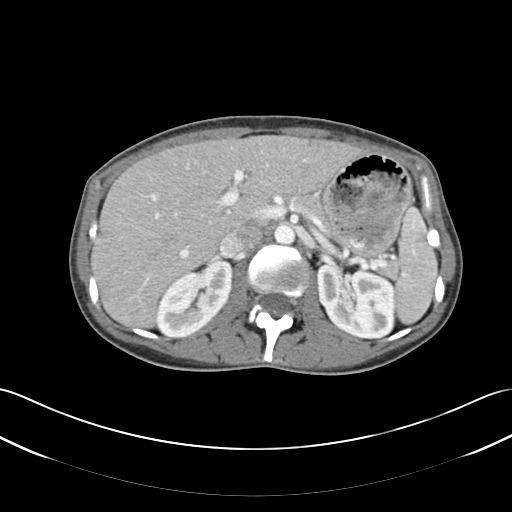
[im 97/127  lung]
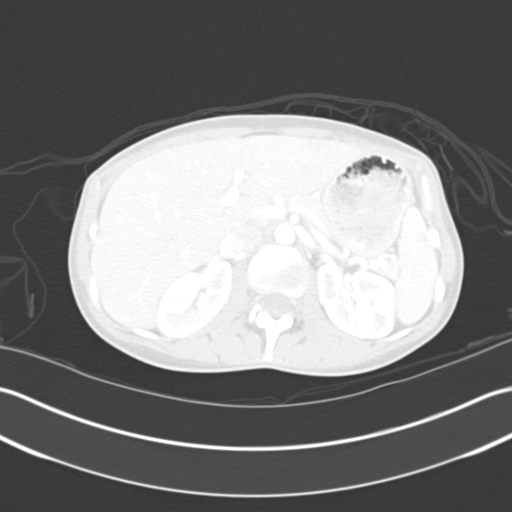
[im 104/127  lung]
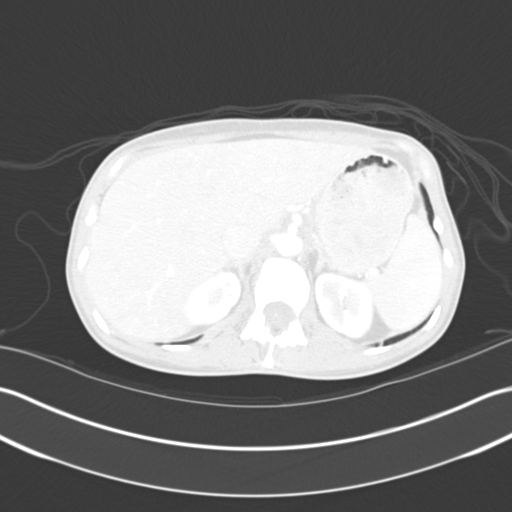
[im 112/127  soft-tissue]
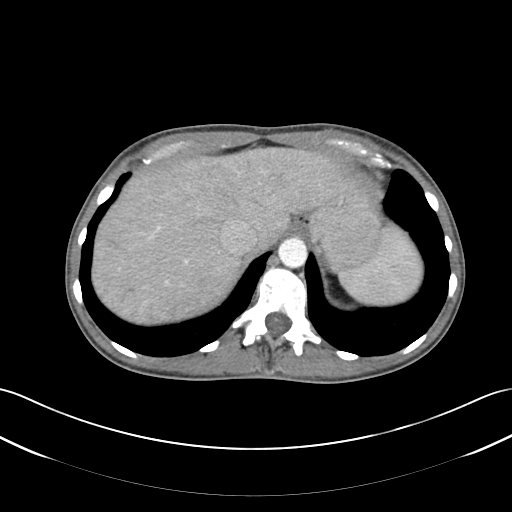
[im 112/127  lung]
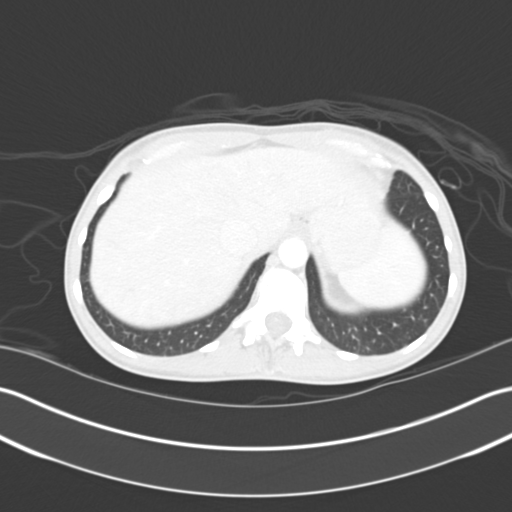
[im 119/127  soft-tissue]
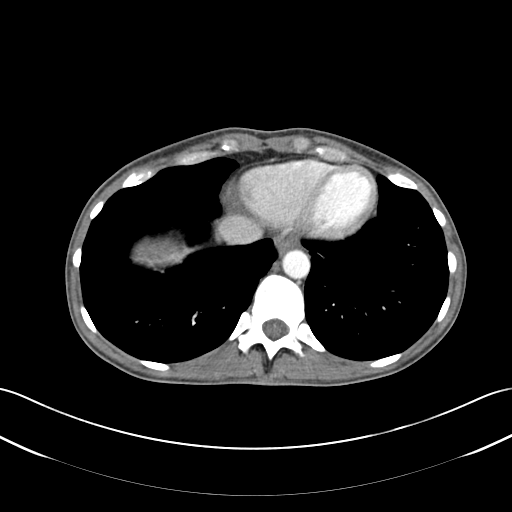
[im 119/127  lung]
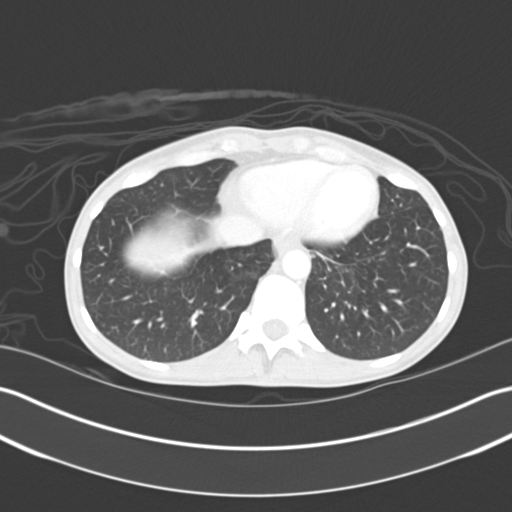

[14 of 32 positions shown; findings below may reference images not displayed]

PROCEDURE:     CT  - CT ABDOMEN / PELVIS  W  - March 11, 2013  [DATE]

RESULT:     CT of the abdomen and pelvis is performed with 100 mL of
1sovue-4XX iodinated intravenous contrast without oral contrast. The patient
has no previous exam for comparison.

Images through the base the lungs demonstrate normal appearing aeration.
There is an abnormal appearance the gallbladder. The wall is enhancing and
may be somewhat thickened. There appears to be some pericholecystic fluid.
Correlate for possible acute cholecystitis. No radiopaque gallstones are
present. Right upper quadrant ultrasound would be recommended.

The liver is mildly enlarged with a length of 18.02 cm on coronal
reconstructions. The kidneys show no obstruction or evidence of stones.
There is a moderate amount of ingested material in the stomach. The
pancreas, liver, spleen, adrenal glands and abdominal aorta appear
unremarkable. The colon is within normal limits. The small bowel is
unremarkable. The uterus, adnexal structures and urinary bladder appear
unremarkable. No adenopathy is evident. The bony structures appear
unremarkable.
IMPRESSION: 1. Findings concerning for acute cholecystitis. Right upper quadrant
abdominal ultrasound is recommended.

[REDACTED]

## 2015-02-16 IMAGING — US ABDOMEN ULTRASOUND LIMITED
1 series · 14 of 25 positions shown · non-contrast
Comparison: none

REASON FOR EXAM: ruq pain, distended gb on ct
COMMENTS:   Body Site: GB and Fossa, CBD, Head of Pancreas

[Series 1: abdomen ultrasound limited · 0.23mm/px · 14 of 53 slices shown]
[im 1/53]
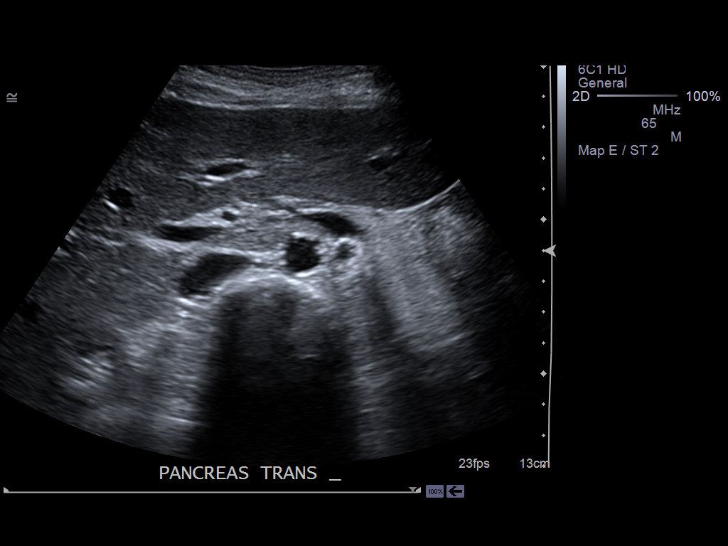
[im 5/53]
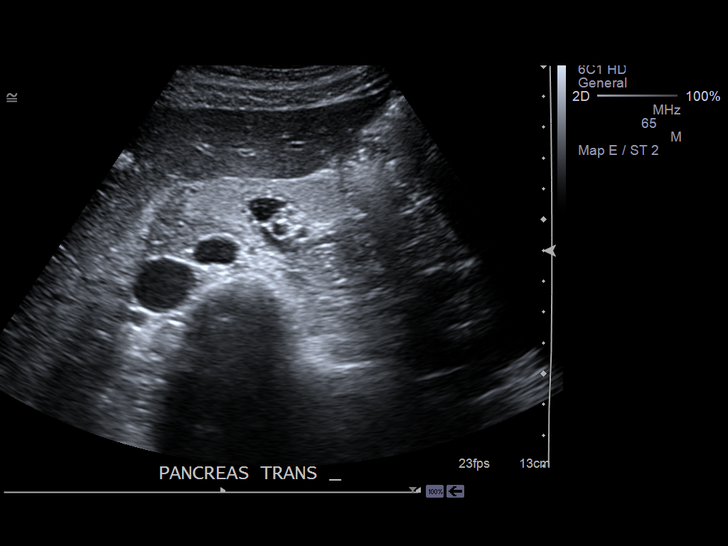
[im 9/53]
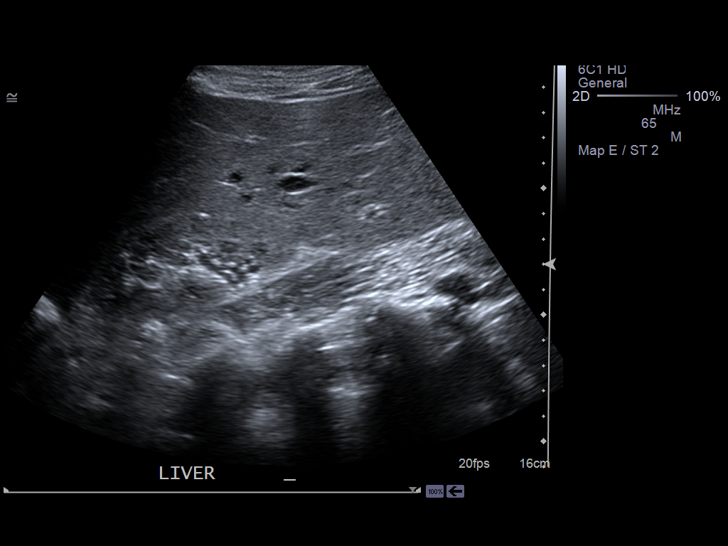
[im 14/53]
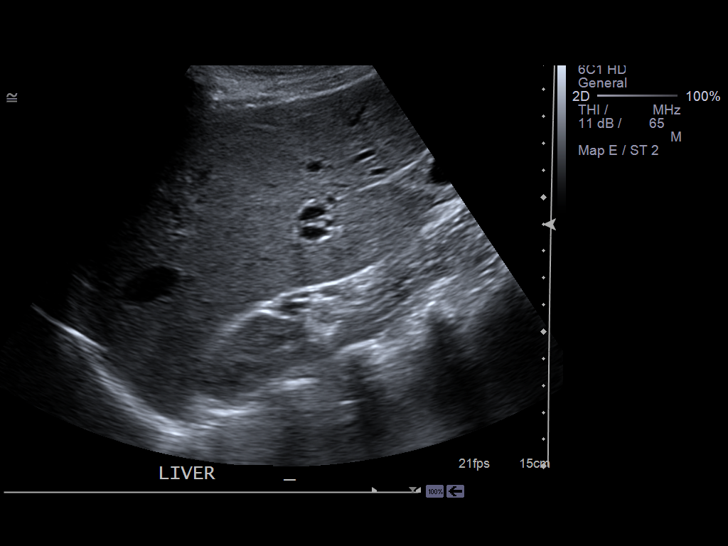
[im 18/53]
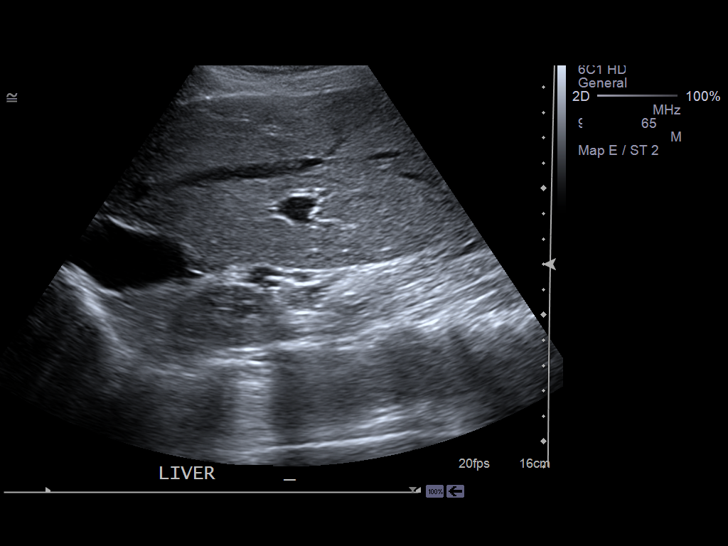
[im 20/53]
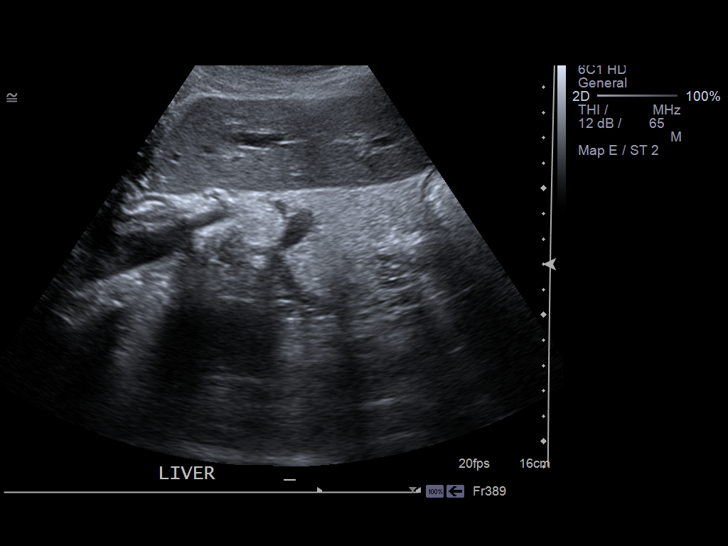
[im 24/53]
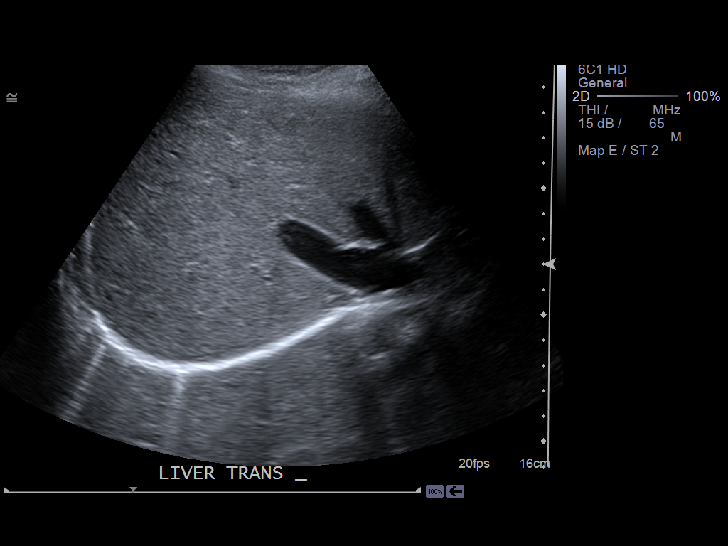
[im 29/53]
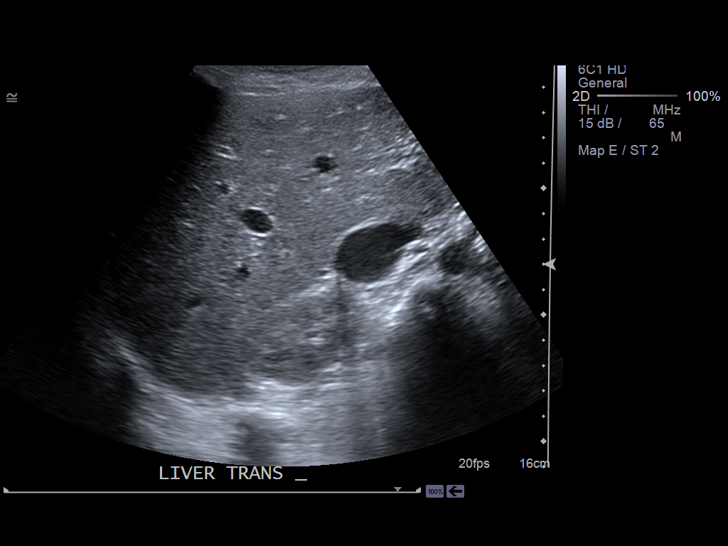
[im 33/53]
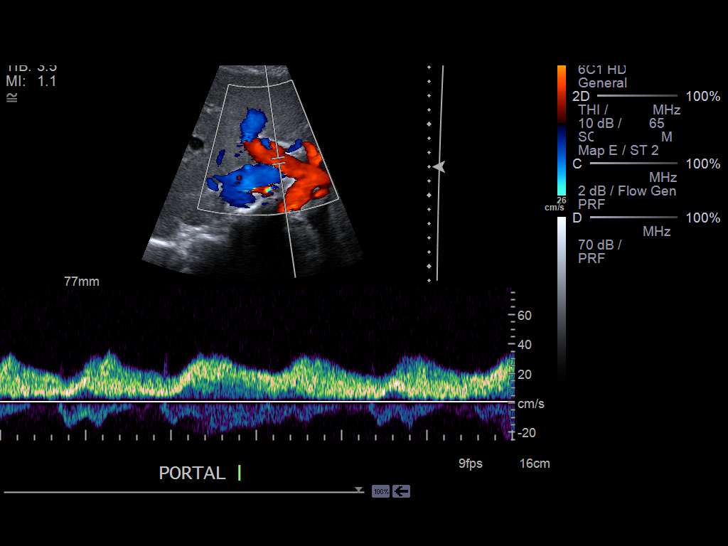
[im 35/53]
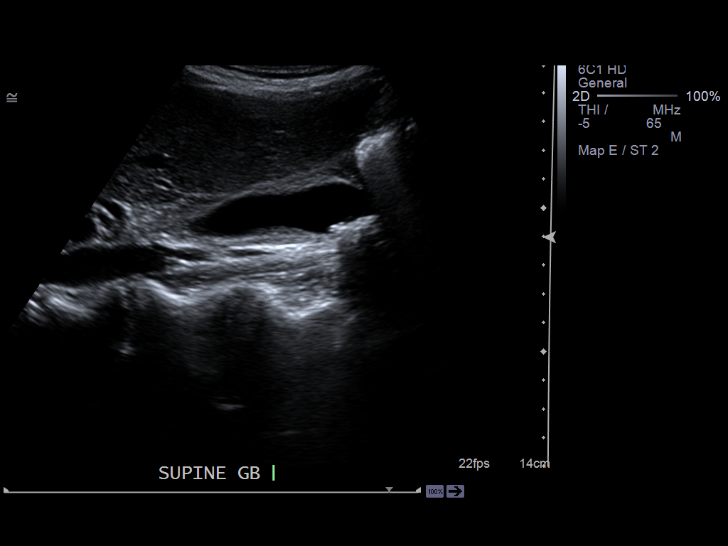
[im 40/53]
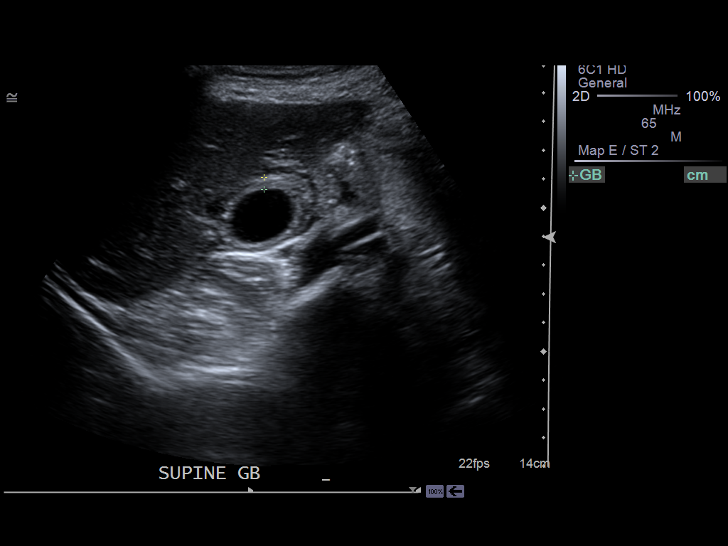
[im 44/53]
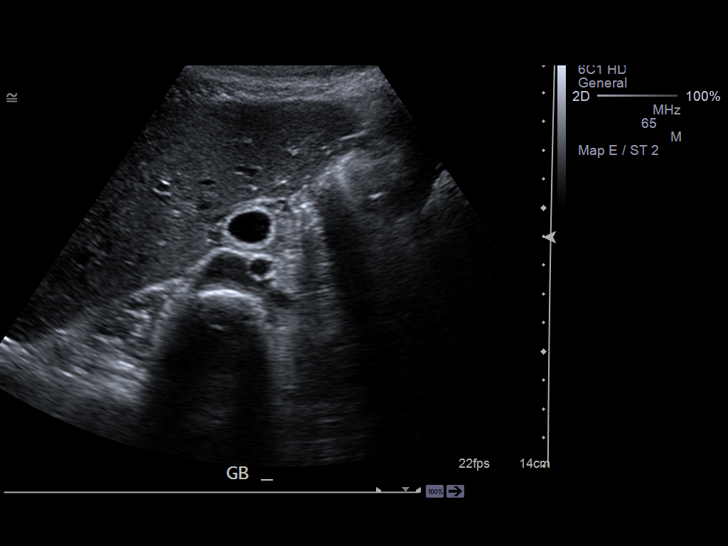
[im 48/53]
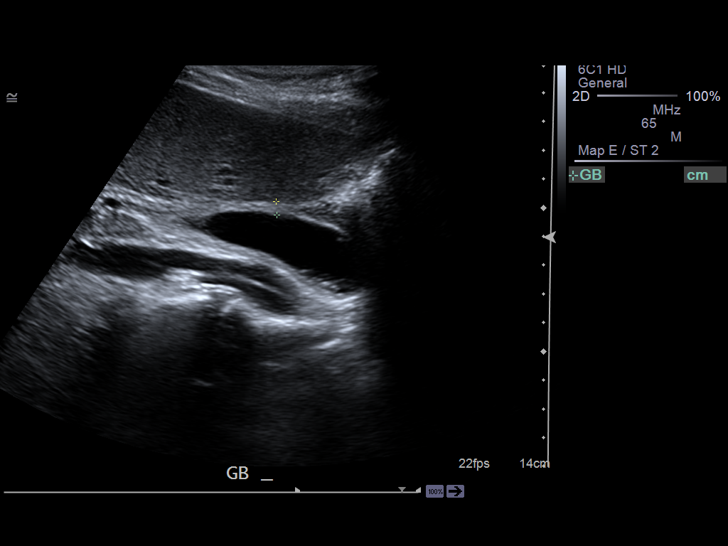
[im 53/53]
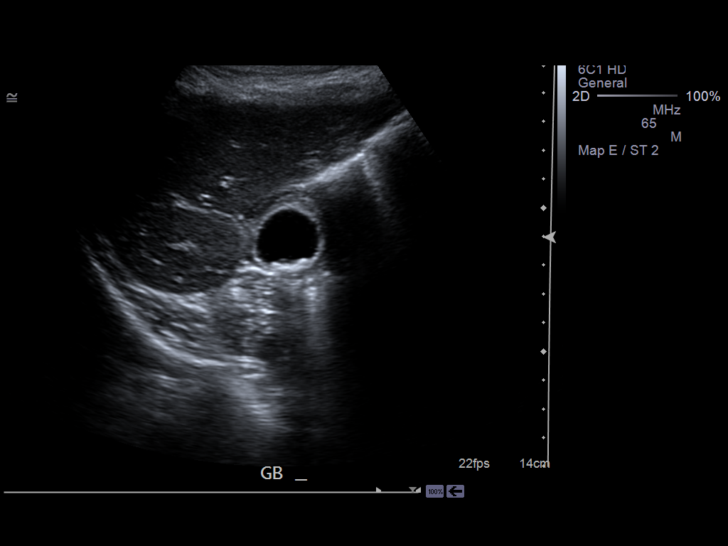

[14 of 25 positions shown; findings below may reference images not displayed]

PROCEDURE:     US  - US ABDOMEN LIMITED SURVEY  - March 11, 2013  [DATE]

RESULT:     History: Pain.

Comparison Study: CT of 03/11/2013. Liver normal measuring. Pancreas normal.
Multiple gallstones. Gallbladder wall is thickened at 4.7 mm. Positive
Murphy sign. These findings are consistent with cholelithiasis and
cholecystitis. Common bile duct caliber is normal at 5.3 mm.
IMPRESSION: Findings consistent with cholelithiasis and cholecystitis.

## 2015-10-25 ENCOUNTER — Encounter: Payer: Self-pay | Admitting: Emergency Medicine

## 2015-10-25 ENCOUNTER — Ambulatory Visit
Admission: EM | Admit: 2015-10-25 | Discharge: 2015-10-25 | Discharge status: Absent without leave | Payer: BLUE CROSS/BLUE SHIELD

## 2015-10-25 ENCOUNTER — Emergency Department
Admission: EM | Admit: 2015-10-25 | Discharge: 2015-10-25 | Disposition: A | Discharge status: Home / Self Care | Payer: BLUE CROSS/BLUE SHIELD | Attending: Emergency Medicine | Admitting: Emergency Medicine

## 2015-10-25 DIAGNOSIS — Z87891 Personal history of nicotine dependence: Secondary | ICD-10-CM | POA: Diagnosis not present

## 2015-10-25 DIAGNOSIS — H6691 Otitis media, unspecified, right ear: Secondary | ICD-10-CM | POA: Insufficient documentation

## 2015-10-25 DIAGNOSIS — Z888 Allergy status to other drugs, medicaments and biological substances status: Secondary | ICD-10-CM | POA: Diagnosis not present

## 2015-10-25 DIAGNOSIS — Z880 Allergy status to penicillin: Secondary | ICD-10-CM | POA: Insufficient documentation

## 2015-10-25 DIAGNOSIS — J320 Chronic maxillary sinusitis: Secondary | ICD-10-CM

## 2015-10-25 DIAGNOSIS — Z9049 Acquired absence of other specified parts of digestive tract: Secondary | ICD-10-CM | POA: Diagnosis not present

## 2015-10-25 DIAGNOSIS — H9201 Otalgia, right ear: Secondary | ICD-10-CM | POA: Diagnosis present

## 2015-10-25 HISTORY — DX: Attention-deficit hyperactivity disorder, unspecified type: F90.9

## 2015-10-25 MED ORDER — KETOROLAC TROMETHAMINE 30 MG/ML IJ SOLN
30.0000 mg | Freq: Once | INTRAMUSCULAR | Status: AC
  Administered 2015-10-25: 30 mg via INTRAMUSCULAR

## 2015-10-25 MED ORDER — TRAMADOL HCL 50 MG PO TABS: 50.0000 mg | ORAL_TABLET | Freq: Four times a day (QID) | ORAL | Status: DC | PRN

## 2015-10-25 MED ORDER — FLUTICASONE PROPIONATE 50 MCG/ACT NA SUSP: 2.0000 | Freq: Every day | NASAL | Status: DC

## 2015-10-25 MED ORDER — KETOROLAC TROMETHAMINE 30 MG/ML IJ SOLN
INTRAMUSCULAR | Status: AC
  Administered 2015-10-25: 30 mg via INTRAMUSCULAR
  Filled 2015-10-25: qty 1

## 2015-10-25 MED ORDER — SULFAMETHOXAZOLE-TRIMETHOPRIM 800-160 MG PO TABS: 1.0000 | ORAL_TABLET | Freq: Two times a day (BID) | ORAL | Status: DC

## 2015-10-25 NOTE — Discharge Instructions (Signed)
Otitis Media, Adult  Otitis media is redness, soreness, and inflammation of the middle ear. Otitis media may be caused by allergies or, most commonly, by infection. Often it occurs as a complication of the common cold.  SIGNS AND SYMPTOMS  Symptoms of otitis media may include:   Earache.   Fever.   Ringing in your ear.   Headache.   Leakage of fluid from the ear.  DIAGNOSIS  To diagnose otitis media, your health care provider will examine your ear with an otoscope. This is an instrument that allows your health care provider to see into your ear in order to examine your eardrum. Your health care provider also will ask you questions about your symptoms.  TREATMENT   Typically, otitis media resolves on its own within 3-5 days. Your health care provider may prescribe medicine to ease your symptoms of pain. If otitis media does not resolve within 5 days or is recurrent, your health care provider may prescribe antibiotic medicines if he or she suspects that a bacterial infection is the cause.  HOME CARE INSTRUCTIONS    If you were prescribed an antibiotic medicine, finish it all even if you start to feel better.   Take medicines only as directed by your health care provider.   Keep all follow-up visits as directed by your health care provider.  SEEK MEDICAL CARE IF:   You have otitis media only in one ear, or bleeding from your nose, or both.   You notice a lump on your neck.   You are not getting better in 3-5 days.   You feel worse instead of better.  SEEK IMMEDIATE MEDICAL CARE IF:    You have pain that is not controlled with medicine.   You have swelling, redness, or pain around your ear or stiffness in your neck.   You notice that part of your face is paralyzed.   You notice that the bone behind your ear (mastoid) is tender when you touch it.  MAKE SURE YOU:    Understand these instructions.   Will watch your condition.   Will get help right away if you are not doing well or get worse.     This  information is not intended to replace advice given to you by your health care provider. Make sure you discuss any questions you have with your health care provider.     Document Released: 03/31/2004 Document Revised: 07/17/2014 Document Reviewed: 01/21/2013  Elsevier Interactive Patient Education 2016 [employer]  Sinusitis, Adult  Sinusitis is redness, soreness, and inflammation of the paranasal sinuses. Paranasal sinuses are air pockets within the bones of your face. They are located beneath your eyes, in the middle of your forehead, and above your eyes. In healthy paranasal sinuses, mucus is able to drain out, and air is able to circulate through them by way of your nose. However, when your paranasal sinuses are inflamed, mucus and air can become trapped. This can allow bacteria and other germs to grow and cause infection.  Sinusitis can develop quickly and last only a short time (acute) or continue over a long period (chronic). Sinusitis that lasts for more than 12 weeks is considered chronic.  CAUSES  Causes of sinusitis include:   Allergies.   Structural abnormalities, such as displacement of the cartilage that separates your nostrils (deviated septum), which can decrease the air flow through your nose and sinuses and affect sinus drainage.   Functional abnormalities, such as when the small hairs (cilia) that   line your sinuses and help remove mucus do not work properly or are not present.  SIGNS AND SYMPTOMS  Symptoms of acute and chronic sinusitis are the same. The primary symptoms are pain and pressure around the affected sinuses. Other symptoms include:   Upper toothache.   Earache.   Headache.   Bad breath.   Decreased sense of smell and taste.   A cough, which worsens when you are lying flat.   Fatigue.   Fever.   Thick drainage from your nose, which often is green and may contain pus (purulent).   Swelling and warmth over the affected sinuses.  DIAGNOSIS  Your health care provider will  perform a physical exam. During your exam, your health care provider may perform any of the following to help determine if you have acute sinusitis or chronic sinusitis:   Look in your nose for signs of abnormal growths in your nostrils (nasal polyps).   Tap over the affected sinus to check for signs of infection.   View the inside of your sinuses using an imaging device that has a light attached (endoscope).  If your health care provider suspects that you have chronic sinusitis, one or more of the following tests may be recommended:   Allergy tests.   Nasal culture. A sample of mucus is taken from your nose, sent to a lab, and screened for bacteria.   Nasal cytology. A sample of mucus is taken from your nose and examined by your health care provider to determine if your sinusitis is related to an allergy.  TREATMENT  Most cases of acute sinusitis are related to a viral infection and will resolve on their own within 10 days. Sometimes, medicines are prescribed to help relieve symptoms of both acute and chronic sinusitis. These may include pain medicines, decongestants, nasal steroid sprays, or saline sprays.  However, for sinusitis related to a bacterial infection, your health care provider will prescribe antibiotic medicines. These are medicines that will help kill the bacteria causing the infection.  Rarely, sinusitis is caused by a fungal infection. In these cases, your health care provider will prescribe antifungal medicine.  For some cases of chronic sinusitis, surgery is needed. Generally, these are cases in which sinusitis recurs more than 3 times per year, despite other treatments.  HOME CARE INSTRUCTIONS   Drink plenty of water. Water helps thin the mucus so your sinuses can drain more easily.   Use a humidifier.   Inhale steam 3-4 times a day (for example, sit in the bathroom with the shower running).   Apply a warm, moist washcloth to your face 3-4 times a day, or as directed by your health care  provider.   Use saline nasal sprays to help moisten and clean your sinuses.   Take medicines only as directed by your health care provider.   If you were prescribed either an antibiotic or antifungal medicine, finish it all even if you start to feel better.  SEEK IMMEDIATE MEDICAL CARE IF:   You have increasing pain or severe headaches.   You have nausea, vomiting, or drowsiness.   You have swelling around your face.   You have vision problems.   You have a stiff neck.   You have difficulty breathing.     This information is not intended to replace advice given to you by your health care provider. Make sure you discuss any questions you have with your health care provider.     Document Released: 06/26/2005 Document Revised:   07/17/2014 Document Reviewed: 07/11/2011  Elsevier Interactive Patient Education 2016 [employer] Inc[employer]

## 2015-10-25 NOTE — ED Notes (Signed)
Pt presents with sinus pain and right ear pain. Pt denies fever.

## 2015-10-25 NOTE — ED Notes (Signed)
States she developed pain to right ear and having sinus pressure  No fever or trauma

## 2015-10-25 NOTE — ED Provider Notes (Signed)
[facility] Emergency Department Provider Note  ____________________________________________  Time seen: Approximately 5:58 PM  I have reviewed the triage vital signs and the nursing notes.   HISTORY  Chief Complaint Otalgia    HPI Kendra Bishop is a 35 y.o. female , NAD, presents to the emergency department with 1 day history of severe right ear pain. Notes the pain onset at 1am today and has been progressively worsening. Has had allergy symptoms over the last few days and significant right sided sinus pressure today with teeth aching. Denies fevers, chills, body aches. No loss or change in hearing. No discharge from the right ear.    Past Medical History  Diagnosis Date  . GERD (gastroesophageal reflux disease)   . ADHD (attention deficit hyperactivity disorder)     Patient Active Problem List   Diagnosis Date Noted  . PROM (premature rupture of membranes) 08/07/2014    Past Surgical History  Procedure Laterality Date  . Cesarean section    . Cholecysectomy    . Cesarean section N/A 08/05/2014    Procedure: CESAREAN SECTION;  Surgeon: Leslie AndreaJames E Tomblin II, MD;  Location: [facility] ORS;  Service: Obstetrics;  Laterality: N/A;    Current Outpatient Rx  Name  Route  Sig  Dispense  Refill  . fluticasone (FLONASE) 50 MCG/ACT nasal spray   Each Nare   Place 2 sprays into both nostrils daily.   16 g   0   . sulfamethoxazole-trimethoprim (BACTRIM DS,SEPTRA DS) 800-160 MG tablet   Oral   Take 1 tablet by mouth 2 (two) times daily.   20 tablet   0   . traMADol (ULTRAM) 50 MG tablet   Oral   Take 1 tablet (50 mg total) by mouth every 6 (six) hours as needed.   10 tablet   0     Allergies Dimetapp cold-allergy and Penicillins  No family history on file.  Social History Social History  Substance Use Topics  . Smoking status: Former Smoker    Quit date: 05/05/2014  . Smokeless tobacco: None  . Alcohol Use: No     Review of  Systems Constitutional: No fever/chills, fatigue Eyes: No visual changes. No discharge, redness, swelling ENT: Positive right ear pain, right sinus pressure, nasal congestion. No sore throat. Cardiovascular: No chest pain. Respiratory: No cough. No shortness of breath. No wheezing.  Musculoskeletal: Negative for general myalgias.  Skin: Negative for rash, skin sores. Neurological: Negative for headaches, focal weakness or numbness. 10-point ROS otherwise negative.  ____________________________________________   PHYSICAL EXAM:  VITAL SIGNS: ED Triage Vitals  Enc Vitals Group     BP 10/25/15 1736 143/87 mmHg     Pulse Rate 10/25/15 1736 56     Resp 10/25/15 1736 18     Temp 10/25/15 1736 97.9 F (36.6 C)     Temp Source 10/25/15 1736 Oral     SpO2 10/25/15 1736 99 %     Weight 10/25/15 1736 106 lb (48.081 kg)     Height 10/25/15 1736 5' (1.524 m)     Head Cir --      Peak Flow --      Pain Score 10/25/15 1736 10     Pain Loc --      Pain Edu? --      Excl. in GC? --      Constitutional: Alert and oriented. Well appearing and in no acute distress. In pain.  Eyes: Conjunctivae are normal. PERRL. EOMI without pain.  Head: Atraumatic. ENT:      Ears: Right TM visualized with severe erythema and mild bulging. No perforation or discharge. Left TM visualized with no effusion, erythema, bulging, perforation.       Nose: Mild congestion with trace purulent rhinnorhea.      Mouth/Throat: Mucous membranes are moist. Pharynx without erythema, swelling, exudate. Clear PND Neck: Supple with FROM. Hematological/Lymphatic/Immunilogical: No cervical lymphadenopathy. Cardiovascular: Normal rate, regular rhythm. Normal S1 and S2.  Good peripheral circulation. Respiratory: Normal respiratory effort without tachypnea or retractions. Lungs CTAB with breath sounds heard in all lung fields. Neurologic:  Normal speech and language. No gross focal neurologic deficits are appreciated.  Skin:   Skin is warm, dry and intact. No rash noted. Psychiatric: Mood and affect are normal. Speech and behavior are normal. Patient exhibits appropriate insight and judgement.   ____________________________________________   LABS  None  ____________________________________________  EKG  None ____________________________________________  RADIOLOGY  None ____________________________________________    PROCEDURES  Procedure(s) performed: None      Medications  ketorolac (TORADOL) 30 MG/ML injection 30 mg (30 mg Intramuscular Given 10/25/15 1815)     ____________________________________________   INITIAL IMPRESSION / ASSESSMENT AND PLAN / ED COURSE  Patient's diagnosis is consistent with acute right otitis media and acute right maxillary sinusitis. Patient will be discharged home with prescriptions for Bactrim DS, Flonase and Ultram to use as directed. Patient is to follow up with her PCP or Dr Delfin Edis in ENT if symptoms persist past this treatment course. Patient is given ED precautions to return to the ED for any worsening or new symptoms.     ____________________________________________  FINAL CLINICAL IMPRESSION(S) / ED DIAGNOSES  Final diagnoses:  Acute right otitis media, recurrence not specified, unspecified otitis media type  Right maxillary sinusitis      NEW MEDICATIONS STARTED DURING THIS VISIT:  New Prescriptions   FLUTICASONE (FLONASE) 50 MCG/ACT NASAL SPRAY    Place 2 sprays into both nostrils daily.   SULFAMETHOXAZOLE-TRIMETHOPRIM (BACTRIM DS,SEPTRA DS) 800-160 MG TABLET    Take 1 tablet by mouth 2 (two) times daily.   TRAMADOL (ULTRAM) 50 MG TABLET    Take 1 tablet (50 mg total) by mouth every 6 (six) hours as needed.         Hope Pigeon, PA-C 10/25/15 1826  Governor Rooks, MD 10/25/15 1911

## 2015-10-28 ENCOUNTER — Telehealth: Payer: Self-pay | Admitting: Family Medicine

## 2015-10-28 NOTE — Telephone Encounter (Signed)
Patient is having pain/drainage with light bleeding in her ear. Went to the ER on Monday April 17th and received an antibiotic but patient is still having pain and drainage in her ear. She would like to schedule an appt. with you as soon as possible. Patient has not been seen since 2011. Please call patient to advise. [phone] If not answer please call [phone]

## 2024-04-21 ENCOUNTER — Encounter: Payer: Self-pay | Admitting: Family Medicine

## 2024-04-21 ENCOUNTER — Ambulatory Visit (INDEPENDENT_AMBULATORY_CARE_PROVIDER_SITE_OTHER): Payer: PRIVATE HEALTH INSURANCE | Admitting: Family Medicine

## 2024-04-21 VITALS — BP 113/76 | HR 82 | Resp 16 | Ht 62.0 in | Wt 111.0 lb

## 2024-04-21 DIAGNOSIS — F902 Attention-deficit hyperactivity disorder, combined type: Secondary | ICD-10-CM

## 2024-04-21 DIAGNOSIS — Z1159 Encounter for screening for other viral diseases: Secondary | ICD-10-CM

## 2024-04-21 DIAGNOSIS — Z6820 Body mass index (BMI) 20.0-20.9, adult: Secondary | ICD-10-CM

## 2024-04-21 DIAGNOSIS — K219 Gastro-esophageal reflux disease without esophagitis: Secondary | ICD-10-CM | POA: Diagnosis not present

## 2024-04-21 DIAGNOSIS — F17290 Nicotine dependence, other tobacco product, uncomplicated: Secondary | ICD-10-CM

## 2024-04-21 DIAGNOSIS — M7711 Lateral epicondylitis, right elbow: Secondary | ICD-10-CM

## 2024-04-21 DIAGNOSIS — N951 Menopausal and female climacteric states: Secondary | ICD-10-CM | POA: Diagnosis not present

## 2024-04-21 DIAGNOSIS — Z7689 Persons encountering health services in other specified circumstances: Secondary | ICD-10-CM

## 2024-04-21 DIAGNOSIS — Z1231 Encounter for screening mammogram for malignant neoplasm of breast: Secondary | ICD-10-CM

## 2024-04-21 NOTE — Progress Notes (Signed)
 New Patient Office Visit  Introduced to nurse practitioner role and practice setting.  All questions answered.  Discussed provider/patient relationship and expectations.  Subjective    Patient ID: Kendra Bishop, female    DOB: 09/15/1980  Age: 43 y.o. MRN: [id number]  CC:  Chief Complaint  Patient presents with   New Patient (Initial Visit)    NP/ Est Care   HPI  Discussed the use of AI scribe software for clinical note transcription with the patient, who gave verbal consent to proceed.  History of Present Illness Kendra Bishop is a 43 year old female who presents for reestablishing primary care and evaluation of perimenopausal symptoms.  She experiences irregular menstrual cycles and episodes of night sweats, which she attributes to perimenopause. Her menstrual cycles are unpredictable in timing and duration, with a notable episode of intense sweating that resolved after a few minutes. She reports that after breastfeeding her last child in 2016/2017 her periods returned to normal but have always been somewhat irregular.  Her medical history includes gastric reflux and ADHD, for which she takes Adderall 20mg  daily- prescribed by Dr.Kaur . She has undergone two C-sections and had her gallbladder removed in September 2014. She is currently managing tennis elbow with medication prescribed by [facility], taken as needed, and performs exercises at home.  Her family history reveals her father had a heart attack, and her mother has thyroid issues and tachycardia. There is a strong family history of thyroid disease on her mother's side, affecting most of her mother's siblings.  Socially, she helps managed an apartment complex, is married with two children, and enjoys traveling and outdoor activities. She has transitioned from smoking cigarettes to vaping and is attempting to reduce her nicotine intake. She wants to maintain her health and is considering taking multivitamins, although she is  hesitant  Flowsheet Row Office Visit from 04/21/2024 in [facility]  PHQ-9 Total Score 3       04/21/2024    1:57 PM  GAD 7 : Generalized Anxiety Score  Nervous, Anxious, on Edge 1  Control/stop worrying 1  Worry too much - different things 1  Trouble relaxing 1  Restless 1  Easily annoyed or irritable 1  Afraid - awful might happen 1  Total GAD 7 Score 7  Anxiety Difficulty Not difficult at all     Outpatient Encounter Medications as of 04/21/2024  Medication Sig   ADDERALL 20 MG tablet Take 20 mg by mouth daily.   meloxicam (MOBIC) 15 MG tablet Take 15 mg by mouth daily.   [DISCONTINUED] fluticasone  (FLONASE ) 50 MCG/ACT nasal spray Place 2 sprays into both nostrils daily. (Patient not taking: Reported on 04/21/2024)   [DISCONTINUED] sulfamethoxazole -trimethoprim  (BACTRIM  DS,SEPTRA  DS) 800-160 MG tablet Take 1 tablet by mouth 2 (two) times daily. (Patient not taking: Reported on 04/21/2024)   [DISCONTINUED] traMADol  (ULTRAM ) 50 MG tablet Take 1 tablet (50 mg total) by mouth every 6 (six) hours as needed. (Patient not taking: Reported on 04/21/2024)   No facility-administered encounter medications on file as of 04/21/2024.    Past Medical History:  Diagnosis Date   ADHD (attention deficit hyperactivity disorder)    GERD (gastroesophageal reflux disease)     Past Surgical History:  Procedure Laterality Date   CESAREAN SECTION     CESAREAN SECTION N/A 08/05/2014   Procedure: CESAREAN SECTION;  Surgeon: Lynwood FORBES Curlene PONCE, MD;  Location: [facility];  Service: Obstetrics;  Laterality: N/A;   cholecysectomy  2014    Family History  Problem Relation Age of Onset   Thyroid disease Mother    Arrhythmia Mother    Heart attack Father    Healthy Son    Healthy Son     Social History   Socioeconomic History   Marital status: Married    Spouse name: Not on file   Number of children: Not on file   Years of education: Not on file   Highest  education level: Not on file  Occupational History   Not on file  Tobacco Use   Smoking status: Former    Current packs/day: 0.00    Types: Cigarettes    Quit date: 05/05/2014    Years since quitting: 9.9   Smokeless tobacco: Not on file  Substance and Sexual Activity   Alcohol use: No   Drug use: No   Sexual activity: Never  Other Topics Concern   Not on file  Social History Narrative   Not on file   Social Drivers of Health   Financial Resource Strain: Not on file  Food Insecurity: Not on file  Transportation Needs: Not on file  Physical Activity: Not on file  Stress: Not on file  Social Connections: Not on file  Intimate Partner Violence: Not on file    ROS      Objective    BP 113/76 (BP Location: Left Arm, Patient Position: Sitting, Cuff Size: Normal)   Pulse 82   Resp 16   Ht 5' 2 (1.575 m)   Wt 111 lb (50.3 kg)   SpO2 100%   BMI 20.30 kg/m   Physical Exam Constitutional:      General: She is not in acute distress.    Appearance: Normal appearance. She is normal weight. She is not ill-appearing, toxic-appearing or diaphoretic.  HENT:     Head: Normocephalic.     Right Ear: Tympanic membrane normal. There is no impacted cerumen.     Left Ear: Tympanic membrane normal. There is no impacted cerumen.     Nose: Nose normal. No congestion or rhinorrhea.     Mouth/Throat:     Mouth: Mucous membranes are moist.     Pharynx: Oropharynx is clear.  Eyes:     Extraocular Movements: Extraocular movements intact.     Pupils: Pupils are equal, round, and reactive to light.  Neck:     Vascular: No carotid bruit.  Cardiovascular:     Rate and Rhythm: Normal rate and regular rhythm.     Pulses: Normal pulses.     Heart sounds: Normal heart sounds. No murmur heard.    No friction rub. No gallop.  Pulmonary:     Effort: No respiratory distress.     Breath sounds: No stridor. No wheezing, rhonchi or rales.  Chest:     Chest wall: No tenderness.   Musculoskeletal:     Right lower leg: No edema.     Left lower leg: No edema.  Lymphadenopathy:     Cervical: No cervical adenopathy.  Skin:    General: Skin is warm and dry.     Capillary Refill: Capillary refill takes less than 2 seconds.     Coloration: Skin is not jaundiced.     Findings: No bruising or lesion.  Neurological:     General: No focal deficit present.     Mental Status: She is alert and oriented to person, place, and time. Mental status is at baseline.     Cranial Nerves: No  cranial nerve deficit.     Motor: No weakness.     Gait: Gait normal.  Psychiatric:        Mood and Affect: Mood normal.        Behavior: Behavior normal.        Thought Content: Thought content normal.        Judgment: Judgment normal.         Assessment & Plan:  Assessment and Plan Assessment & Plan New Patient to [facility] -visit to reestablish care.  - Blood pressure is within normal limits. - Order general lab work including iron levels, complete blood count, and thyroid function tests - Order hepatitis C screening - Order mammogram at [facility] - due for PAP - recommend scheduling - Schedule colonoscopy in 10 months when she turns 44  Perimenopausal symptoms  - still gets monthly period, but varies from cycle to cycle how long it will be - intermittent hot flashes, manageable at this time -suggestive of perimenopause - Recommend cessation of nicotine if HRT is wanted in the future - Monitor symptoms and consider revisiting treatment options if symptoms become unmanageable - could think about SSRI or gabapentin for symptoms for non hormonal assistance in future - shared decision making - pt will hold off on any medical treatment at this time. - Encourage regular physical activity to maintain muscle and bone strength - Recommend multivitamin supplementation  - will check TSH and vitamin D levels  Nicotine dependence  - vaping and former  cigarette use - She is attempting to reduce nicotine use. - Encourage reduction in vaping frequency and nicotine concentration - Advise regular dental check-ups and oral hygiene - reduce nicotine mg in vape - reduce vape cartilage change every 2-3 days   Tennis Elbow, Right - seeing [facility] - Continue home exercises for elbow - Avoid concurrent use of NSAIDs like Aleve or ibuprofen   Gastroesophageal reflux disease (GERD) - dietary controlled  Attention-deficit hyperactivity disorder (ADHD) - Adderall 20mg  daily prescribed by psychiatrist Dr. Vincente.  BMI = 20.30 - Continue to make conscious decisions for well balanced diet smaller portions with increase protein, fruits, veggies, water as drink of choice, decrease starches, processed foods, and saturated fats. weekly exercise - 150 minutes per week.     Perimenopausal -     CBC -     TSH -     Vitamin B12 -     VITAMIN D 25 Hydroxy (Vit-D Deficiency, Fractures) -     Iron, TIBC and Ferritin Panel  Gastroesophageal reflux disease without esophagitis  Attention deficit hyperactivity disorder (ADHD), combined type  Vaping nicotine dependence, tobacco product -     CBC -     Iron, TIBC and Ferritin Panel -     Lipid panel  BMI 20.0-20.9, adult -     Comprehensive metabolic panel with GFR -     Lipid panel  Screening for viral disease -     Hepatitis C antibody  Encounter for screening mammogram for malignant neoplasm of breast -     3D Screening Mammogram, Left and Right  Encounter to establish care with new doctor    No follow-ups on file.   Curtis DELENA Boom, FNP

## 2024-04-21 NOTE — Patient Instructions (Signed)

## 2024-04-22 ENCOUNTER — Ambulatory Visit: Payer: Self-pay | Admitting: Family Medicine

## 2024-04-22 LAB — CBC
Hematocrit: 37.8 % (ref 34.0–46.6)
Hemoglobin: 12.2 g/dL (ref 11.1–15.9)
MCH: 31 pg (ref 26.6–33.0)
MCHC: 32.3 g/dL (ref 31.5–35.7)
MCV: 96 fL (ref 79–97)
Platelets: 310 x10E3/uL (ref 150–450)
RBC: 3.93 x10E6/uL (ref 3.77–5.28)
RDW: 12.5 % (ref 11.7–15.4)
WBC: 10.2 x10E3/uL (ref 3.4–10.8)

## 2024-04-22 LAB — COMPREHENSIVE METABOLIC PANEL WITH GFR
ALT: 9 IU/L (ref 0–32)
AST: 17 IU/L (ref 0–40)
Albumin: 4 g/dL (ref 3.9–4.9)
Alkaline Phosphatase: 45 IU/L (ref 41–116)
BUN/Creatinine Ratio: 18 (ref 9–23)
BUN: 14 mg/dL (ref 6–24)
Bilirubin Total: 0.3 mg/dL (ref 0.0–1.2)
CO2: 24 mmol/L (ref 20–29)
Calcium: 9 mg/dL (ref 8.7–10.2)
Chloride: 102 mmol/L (ref 96–106)
Creatinine, Ser: 0.8 mg/dL (ref 0.57–1.00)
Globulin, Total: 2.9 g/dL (ref 1.5–4.5)
Glucose: 84 mg/dL (ref 70–99)
Potassium: 4.5 mmol/L (ref 3.5–5.2)
Sodium: 138 mmol/L (ref 134–144)
Total Protein: 6.9 g/dL (ref 6.0–8.5)
eGFR: 93 mL/min/1.73 (ref >59)

## 2024-04-22 LAB — LIPID PANEL
Chol/HDL Ratio: 3.6 ratio (ref 0.0–4.4)
Cholesterol, Total: 178 mg/dL (ref 100–199)
HDL: 50 mg/dL (ref >39)
LDL Chol Calc (NIH): 100 mg/dL — ABNORMAL HIGH (ref 0–99)
Triglycerides: 164 mg/dL — ABNORMAL HIGH (ref 0–149)
VLDL Cholesterol Cal: 28 mg/dL (ref 5–40)

## 2024-04-22 LAB — IRON,TIBC AND FERRITIN PANEL
Ferritin: 78 ng/mL (ref 15–150)
Iron Saturation: 28 % (ref 15–55)
Iron: 72 ug/dL (ref 27–159)
Total Iron Binding Capacity: 259 ug/dL (ref 250–450)
UIBC: 187 ug/dL (ref 131–425)

## 2024-04-22 LAB — VITAMIN D 25 HYDROXY (VIT D DEFICIENCY, FRACTURES): Vit D, 25-Hydroxy: 29.6 ng/mL — ABNORMAL LOW (ref 30.0–100.0)

## 2024-04-22 LAB — VITAMIN B12: Vitamin B-12: 420 pg/mL (ref 232–1245)

## 2024-04-22 LAB — TSH: TSH: 1.44 u[IU]/mL (ref 0.450–4.500)

## 2024-04-22 LAB — HEPATITIS C ANTIBODY: Hep C Virus Ab: NONREACTIVE
